# Patient Record
Sex: Male | Born: 1980 | Race: White | Hispanic: No | Marital: Married | Smoking: Former smoker
Health system: Southern US, Community
[De-identification: ages and names within clinical notes are randomized; demographics above are authoritative.]

## PROBLEM LIST (undated history)

## (undated) DIAGNOSIS — E559 Vitamin D deficiency, unspecified: Secondary | ICD-10-CM

## (undated) DIAGNOSIS — D229 Melanocytic nevi, unspecified: Secondary | ICD-10-CM

## (undated) DIAGNOSIS — G4700 Insomnia, unspecified: Secondary | ICD-10-CM

## (undated) DIAGNOSIS — I4891 Unspecified atrial fibrillation: Secondary | ICD-10-CM

## (undated) HISTORY — PX: ORIF ANKLE FRACTURE: SUR919

## (undated) HISTORY — PX: FEMUR FRACTURE SURGERY: SHX633

## (undated) HISTORY — DX: Melanocytic nevi, unspecified: D22.9

## (undated) HISTORY — DX: Vitamin D deficiency, unspecified: E55.9

## (undated) HISTORY — DX: Insomnia, unspecified: G47.00

## (undated) HISTORY — PX: FINGER SURGERY: SHX640

---

## 2012-10-26 ENCOUNTER — Ambulatory Visit: Payer: Self-pay | Admitting: Family Medicine

## 2012-10-26 VITALS — BP 114/68 | HR 65 | Temp 98.1°F | Resp 16 | Ht 69.5 in | Wt 173.8 lb

## 2012-10-26 DIAGNOSIS — Z0289 Encounter for other administrative examinations: Secondary | ICD-10-CM

## 2012-10-26 DIAGNOSIS — Z0000 Encounter for general adult medical examination without abnormal findings: Secondary | ICD-10-CM

## 2012-10-26 NOTE — Progress Notes (Signed)
[facility] [address], [place] [address] [phone]  Date:  10/26/2012   Name:  Adam Green   DOB:  1981-08-18   MRN:  [id number]  PCP:  No primary provider on file.    Chief Complaint: Employment Physical   History of Present Illness:  Adam Green is a 31 y.o. 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  He is generally very health.  He usually gets a 2 year card.  Works for [employer].  He does not take any medications.  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  He cut it processing a deer.    There is no problem list on file for this patient.   History reviewed. No pertinent past medical history.  History reviewed. No pertinent past surgical history.  History  Substance Use Topics  . Smoking status: Former Smoker    Quit date: 10/26/2006  . Smokeless tobacco: Not on file  . Alcohol Use: 1.8 oz/week    3 Cans of beer per week    History reviewed. No pertinent family history.  No Known Allergies  Medication list has been reviewed and updated.  No current outpatient prescriptions on file prior to visit.    Review of Systems:  As per HPI- otherwise negative.   Physical Examination: Filed Vitals:   10/26/12 1353  BP: 114/68  Pulse: 65  Temp: 98.1 F (36.7 C)  Resp: 16   Filed Vitals:   10/26/12 1353  Height: 5' 9.5" (1.765 m)  Weight: 173 lb 12.8 oz (78.835 kg)   Body mass index is 25.30 kg/(m^2). Ideal Body Weight: Weight in (lb) to have BMI = 25: 171.4   GEN: WDWN, NAD, Non-toxic, A & O x 3 HEENT: Atraumatic, Normocephalic. Neck supple. No masses, No LAD. Ears and Nose: No external deformity. CV: RRR, No M/G/R. No JVD. No thrill. No extra heart sounds. PULM: CTA B, no wheezes, crackles, rhonchi. No retractions. No resp. distress. No accessory muscle use. ABD: S, NT, ND, +BS. No rebound. No  HSM. EXTR: No c/c/e. Normal strength and DTR all extremities.  Missing very tip/ nail from left long finger.   NEURO Normal gait.  PSYCH: Normally interactive. Conversant. Not depressed or anxious appearing.  Calm demeanor.  GU: no hernia  Assessment and Plan: 1. Physical exam, annual    2 year DOT card- normal DOT exam   Abbe Amsterdam, MD

## 2015-07-12 ENCOUNTER — Emergency Department (HOSPITAL_COMMUNITY)
Admission: EM | Admit: 2015-07-12 | Discharge: 2015-07-13 | Disposition: A | Discharge status: Home / Self Care | Payer: BLUE CROSS/BLUE SHIELD | Attending: Emergency Medicine | Admitting: Emergency Medicine

## 2015-07-12 DIAGNOSIS — R2243 Localized swelling, mass and lump, lower limb, bilateral: Secondary | ICD-10-CM | POA: Diagnosis present

## 2015-07-12 DIAGNOSIS — Z7982 Long term (current) use of aspirin: Secondary | ICD-10-CM | POA: Diagnosis not present

## 2015-07-12 DIAGNOSIS — Z87891 Personal history of nicotine dependence: Secondary | ICD-10-CM | POA: Insufficient documentation

## 2015-07-12 DIAGNOSIS — T50905A Adverse effect of unspecified drugs, medicaments and biological substances, initial encounter: Secondary | ICD-10-CM

## 2015-07-12 DIAGNOSIS — T380X5A Adverse effect of glucocorticoids and synthetic analogues, initial encounter: Secondary | ICD-10-CM | POA: Diagnosis not present

## 2015-07-12 LAB — CBC WITH DIFFERENTIAL/PLATELET
Basophils Absolute: 0 10*3/uL (ref 0.0–0.1)
Basophils Relative: 0 % (ref 0–1)
Eosinophils Absolute: 0.1 10*3/uL (ref 0.0–0.7)
Eosinophils Relative: 1 % (ref 0–5)
HCT: 40.9 % (ref 39.0–52.0)
Hemoglobin: 13.3 g/dL (ref 13.0–17.0)
LYMPHS ABS: 1.3 10*3/uL (ref 0.7–4.0)
Lymphocytes Relative: 14 % (ref 12–46)
MCH: 29.2 pg (ref 26.0–34.0)
MCHC: 32.5 g/dL (ref 30.0–36.0)
MCV: 89.7 fL (ref 78.0–100.0)
MONO ABS: 0.7 10*3/uL (ref 0.1–1.0)
Monocytes Relative: 7 % (ref 3–12)
Neutro Abs: 7.3 10*3/uL (ref 1.7–7.7)
Neutrophils Relative %: 78 % — ABNORMAL HIGH (ref 43–77)
PLATELETS: 209 10*3/uL (ref 150–400)
RBC: 4.56 MIL/uL (ref 4.22–5.81)
RDW: 12.8 % (ref 11.5–15.5)
WBC: 9.4 10*3/uL (ref 4.0–10.5)

## 2015-07-12 NOTE — ED Provider Notes (Signed)
TIME SEEN:11:21 PM  CHIEF COMPLAINT: Reaction to steroids  HPI: HPI Comments: Adam Green is a 34 y.o. male, with no pertinent PMhx, who presents to the Emergency Department complaining of sudden onset, gradually worsening, constant bilateral knee and ankle swelling and soreness that began 3 days ago after starting a Dexpack steroid course 4 days ago. Pt describes his BLE pain to be a soreness that feels warm but is not warm to the touch. Pt additionally complains of a 13 pound weight gain since starting the steroid pack 4 days ago. He states 4 days ago he woke up with swelling and pain to right elbow that progressively worsened throughout the day. He visited an Urgent Care on Thursday, 4 days ago, where the area was drained and pt was prescribed Cephalexin and a Dexpak. Pt is on his 4th day of steroid pack and has been compliant with the antibiotic course. He states his right elbow has improved in swelling and pain since Thursday. Denies diarrhea, vomiting, nausea, or fever. Additionally denies a history of gout, known allergies, or a family history of autoimmune disease. Pt is not followed by a PCP. States that his right elbow is feeling much better. Denies any injury to his elbow, knees or ankles. No history of DVT. No recent prolonged immobilization, fracture, surgery, trauma.  ROS: See HPI Constitutional: no fever  Eyes: no drainage  ENT: no runny nose   Cardiovascular:  no chest pain  Resp: no SOB  GI: no vomiting GU: no dysuria Integumentary: no rash  Allergy: no hives  Musculoskeletal: no leg swelling  Neurological: no slurred speech ROS otherwise negative  PAST MEDICAL HISTORY/PAST SURGICAL HISTORY:  No past medical history on file.  MEDICATIONS:  Prior to Admission medications   Medication Sig Start Date End Date Taking? Authorizing Provider  acetaminophen (TYLENOL) 325 MG tablet Take 650 mg by mouth every 6 (six) hours as needed.    Historical Provider, MD  aspirin 325  MG tablet Take 325 mg by mouth. prn    Historical Provider, MD  ibuprofen (ADVIL,MOTRIN) 200 MG tablet Take 200 mg by mouth every 6 (six) hours as needed.    Historical Provider, MD    ALLERGIES:  No Known Allergies  SOCIAL HISTORY:  History  Substance Use Topics  . Smoking status: Former Smoker    Quit date: 10/26/2006  . Smokeless tobacco: Not on file  . Alcohol Use: 1.8 oz/week    3 Cans of beer per week    FAMILY HISTORY: No family history on file.  EXAM: BP 136/71 mmHg  Pulse 60  Temp(Src) 98.8 F (37.1 C) (Oral)  Resp 18  SpO2 98% CONSTITUTIONAL: Alert and oriented and responds appropriately to questions. Well-appearing; well-nourished, afebrile, nontoxic HEAD: Normocephalic EYES: Conjunctivae clear, PERRL ENT: normal nose; no rhinorrhea; moist mucous membranes; pharynx without lesions noted NECK: Supple, no meningismus, no LAD  CARD: RRR; S1 and S2 appreciated; no murmurs, no clicks, no rubs, no gallops RESP: Normal chest excursion without splinting or tachypnea; breath sounds clear and equal bilaterally; no wheezes, no rhonchi, no rales, no hypoxia or respiratory distress, speaking full sentences ABD/GI: Normal bowel sounds; non-distended; soft, non-tender, no rebound, no guarding, no peritoneal signs BACK:  The back appears normal and is non-tender to palpation, there is no CVA tenderness EXT: Normal ROM in all joints; non-tender to palpation; no edema; normal capillary refill; no cyanosis, no calf tenderness or swelling; no joint effusion; compartments are soft; 2+ DP pulses bilaterally; no signs  of septic arthritis or cellulitis; no erythema or warmth; no sign of olecranon bursitis on the right.  SKIN: Normal color for age and race; warm, no rash NEURO: Moves all extremities equally, sensation to light touch intact diffusely, cranial nerves II through XII intact PSYCH: The patient's mood and manner are appropriate. Grooming and personal hygiene are  appropriate.  MEDICAL DECISION MAKING: Patient here with symptoms of pain in his knees, ankles, weight gain and feeling like his extremities are swollen. No obvious sign of swelling on exam. No sign of septic arthritis. No sign of right olecranon bursitis, septic bursitis. Basic labs unremarkable. Hemodynamically stable. Neurologically intact. Doubt septic arthritis, gout or other autoimmune disease. He does not appear volume overload on exam. Neurovascular intact distally. I do not feel he needs x-rays of his extremities given he has no history of injury. No history of DVT and no risk factors for the same. No calf swelling or tenderness on exam. This may be adverse reaction to steroids. He is currently on his fourth day of steroids out of 5. Have discussed with patient I recommend stopping his steroids at this time. Have recommended he continue his Keflex however. Have given him outpatient follow-up information. Discussed return precautions. Discussed importance of rest, elevation of his legs. He verbalizes understanding and is comfortable with this plan.    I personally performed the services described in this documentation, which was scribed in my presence. The recorded information has been reviewed and is accurate.    Williams, DO 07/13/15 [phone]

## 2015-07-12 NOTE — ED Notes (Signed)
Patient noticed Thursday morning that he had an area on his elbow that was swollen up and filled with fluid. He went to urgent care to get it drained and was started on a steroid pack. Since then he has noticed a 13 lb weight gain. He states he has swelling in knees and right ankle, and a "little bit on the left ankle". He states they are achy and sore. The area on his elbow has been decreasing since draining it at urgent care, but he states he is having a reaction to the medication, Dexpak.

## 2015-07-13 LAB — COMPREHENSIVE METABOLIC PANEL
ALBUMIN: 4 g/dL (ref 3.5–5.0)
ALT: 31 U/L (ref 17–63)
AST: 32 U/L (ref 15–41)
Alkaline Phosphatase: 55 U/L (ref 38–126)
Anion gap: 8 (ref 5–15)
BILIRUBIN TOTAL: 0.6 mg/dL (ref 0.3–1.2)
BUN: 17 mg/dL (ref 6–20)
CHLORIDE: 107 mmol/L (ref 101–111)
CO2: 25 mmol/L (ref 22–32)
CREATININE: 0.87 mg/dL (ref 0.61–1.24)
Calcium: 9.2 mg/dL (ref 8.9–10.3)
GFR calc Af Amer: >60 mL/min (ref >60)
GLUCOSE: 115 mg/dL — AB (ref 65–99)
POTASSIUM: 4.1 mmol/L (ref 3.5–5.1)
Sodium: 140 mmol/L (ref 135–145)
TOTAL PROTEIN: 6.8 g/dL (ref 6.5–8.1)

## 2015-07-13 NOTE — Discharge Instructions (Signed)
You were seen in the ED for reaction likely secondary to your steroids. I recommend you stop taking this medication. You may continue your cephalexin. I recommend you keep your legs elevated while at rest. You may take ibuprofen 800 mg every 8 hours as needed for pain. If you develop fever, redness and warmth of your joints with associated swelling, vomiting and cannot stop, please return to the hospital.  Your labs today were normal.    Emergency Department Resource Guide 1) Find a Doctor and Pay Out of Pocket Although you won't have to find out who is covered by your insurance plan, it is a good idea to ask around and get recommendations. You will then need to call the office and see if the doctor you have chosen will accept you as a new patient and what types of options they offer for patients who are self-pay. Some doctors offer discounts or will set up payment plans for their patients who do not have insurance, but you will need to ask so you aren't surprised when you get to your appointment.  2) Contact Your Local Health Department Not all health departments have doctors that can see patients for sick visits, but many do, so it is worth a call to see if yours does. If you don't know where your local health department is, you can check in your phone book. The CDC also has a tool to help you locate your state's health department, and many state websites also have listings of all of their local health departments.  3) Find a [facility] If your illness is not likely to be very severe or complicated, you may want to try a walk in clinic. These are popping up all over the country in pharmacies, drugstores, and shopping centers. They're usually staffed by nurse practitioners or physician assistants that have been trained to treat common illnesses and complaints. They're usually fairly quick and inexpensive. However, if you have serious medical issues or chronic medical problems, these are probably not  your best option.  No Primary Care Doctor: - Call Health Connect at  [phone] - they can help you locate a primary care doctor that  accepts your insurance, provides certain services, etc. - Physician Referral Service- [phone]  Chronic Pain Problems: Organization         Address  Phone   Notes  [facility]  [phone] Patients need to be referred by their primary care doctor.   Medication Assistance: Organization         Address  Phone   Notes  [facility] Medication [facility] [address] [place], [place] [address] [phone] --Must be a resident of [facility] -- Must have NO insurance coverage whatsoever (no Medicaid/ Medicare, etc.) -- The pt. MUST have a primary care doctor that directs their care regularly and follows them in the community   MedAssist  [phone]   [employer]  [phone]    Agencies that provide inexpensive medical care: Organization         Address  Phone   Notes  [place]  [phone]   [facility]    [phone]   [facility] [place], [place] [address] [phone]   [place] [address] [place] [phone]   Planned Parenthood    [phone]   [facility]    [phone])  [place]  [address], [place] Phone:  [phone], Fax:  [phone] Hours of Operation:  9 am - 6 pm, M-F.  Also accepts Medicaid/Medicare and self-pay.  [facility] for [place], [address], [place] Phone: [phone], Fax: [phone]. Hours of Operation:  8:30 am - 5:30 pm, M-F.  Also accepts Medicaid and self-pay.  [facility] [place] [address] [place] Phone: [phone]   [place], [place], [place] [phone], Ext. [phone] Mondays & Thursdays: 7-9 AM.  First  15 patients are seen on a first come, first serve basis.    [place] Providers:  Organization         Address  Phone   Notes  [facility] [address],  [phone] Also accepts self-pay patients.  [facility] [address], [place]  [phone]   [place], [address], [place] [phone]   [facility] [address], [place] [phone]   Lucianne Lei [address], [place]   [phone] Only accepts [place] Access [place] patients after they have their name applied to their card.   Self-Pay (no insurance) in [facility]:  Organization         Address  Phone   Notes  Sickle Cell Patients, [facility] Internal Medicine [place] [phone]   [facility] Urgent Care [place] [phone]   [facility] [place]  [place], [place], [place] [phone]   [facility]/Dr. Osei-Bonsu  [address] [place] or [address], [place] [phone] Phone number for both [place] and [place] locations is the same.  [facility] [place] [address] [place] [phone]   [facility] [address] [place] or [address] [phone]   [facility] [address] [place] [phone], phone; [phone], fax Sees patients 1st and 3rd Saturday of every month.  Must not qualify for public or private insurance (i.e. Medicaid, Medicare, [place] Health Choice, Veterans' Benefits)  Household income should be no more than 200% of the poverty level The clinic cannot treat you if you are pregnant or think you are pregnant  Sexually transmitted diseases are not treated at the clinic.    Dental  Care: Organization         Address  Phone  Notes  [facility] Department of [facility] [place] [phone] Accepts children up to age 29 who are enrolled in [place] or [place]; pregnant women with a Medicaid card; and children who have applied for Medicaid or [place] Health Choice, but were declined, whose parents can pay a reduced fee at time of service.  [facility] Department of [place]  [address], [place] [phone] Accepts children up to age 38 who are enrolled in [place] or [place]; pregnant women with a Medicaid card; and children who have applied for Medicaid or [place] Health Choice, but were declined, whose parents can pay a reduced fee at time of service.  [place] Adult Dental Access  PROGRAM  [place] [phone] Patients are seen by appointment only. Walk-ins are not accepted. [place] will see patients 24 years of age and older. Monday - Tuesday (8am-5pm) Most Wednesdays (8:30-5pm) $30 per visit, cash only  [facility] Adult Dental Access PROGRAM  [address], [facility] [phone] Patients are seen by appointment only. Walk-ins are not accepted. [place] will see patients 16 years of age and older. One Wednesday Evening (Monthly: Volunteer Based).  $30 per visit, cash only  [place]  [phone] for adults; Children under age 49, call Graduate Pediatric Dentistry at [phone]. Children aged 70-14, please call [phone] to request a pediatric application.  Dental services are provided in all areas of dental care including fillings, crowns and bridges, complete and partial dentures, implants, gum treatment, root canals, and extractions. Preventive care is also provided. Treatment is provided to both adults and children. Patients are selected via a lottery and there is often a waiting list.   [facility] [address], [place]  [phone] [email or web address]   [facility] [address] [place], [place] [phone], Ext. [phone] Second and Fourth Thursday of each month, opens at 6:30 AM; Clinic ends at 9 AM.  Patients are seen on a first-come first-served basis, and a limited number are seen during each clinic.   [facility]  [address] Hillard Danker [place], [place] [phone]   Eligibility Requirements You must have lived in [place], [place], or [place] counties for at least the last three months.   You cannot be eligible for state or federal sponsored [employer], including [employer], [place], or [employer].   You generally cannot be eligible for healthcare insurance through your employer.    How to apply: Eligibility screenings are held every Tuesday and Wednesday afternoon from 1:00 pm until 4:00 pm. You do not need an appointment for the interview!  [facility] [address], [place], [place]   [place]  [place]  [place]  [phone]    Behavioral Health Resources in the Community: Intensive Outpatient Programs Organization         Address  Phone  Notes  [place]. [address] [place], [place] [phone]   [facility] Outpatient [address], [place], [place]   ADS: Alcohol & Drug Svcs [address] [place], [place]   [place] [address],  [place], [place] or [phone]   Substance Abuse Resources Organization         Address  Phone  Notes  Alcohol and Drug Services  [phone]   [place]  [phone]   [facility] [place]   Chinita Pester  [phone]   Residential & Outpatient Substance Abuse Program  [phone]    Psychological Services Organization         Address  Phone  Notes  [facility] [place]  [place]  [phone]   [place] [address], [place] or [phone]    Mobile Crisis Teams Organization         Address  Phone  Notes  [facility], Mobile Crisis Care Unit  [phone]   [facility] Psychotherapeutic Services  [address] [place], [place]  [place], [place] [phone]    Self-Help/Support Groups Organization         Address  Phone             Notes  Mental Health Assoc. of [place] - variety of support groups  [place] Call for more information  Narcotics Anonymous (NA), [facility] [address], [employer] [place]  2 meetings at this location   Special educational needs teacher         Address  Phone  Notes  [facility] Residential Treatment [place],    [place]  [phone]   [facility]  [address] [place] [phone], [place], [place]   [place], [place] [phone] Admissions: 8am-3pm M-F  Incentives Substance [place] [address]    [place], [place] [phone]   [facility] [address] [place], [place], [place]   [facility] [address],  [place], [place]   [facility] Programs - Intensive Outpatient [address], [place], [place]   [facility] ([place] [address].,  [place], [place] [phone] or [phone]   [facility]idential Treatment Services (RTS) [address], [place], [place] Accepts Medicaid  [facility] [place] [address]  [place] [phone] Substance Abuse/Addiction Treatment   [facility] Organization         Address  Phone  Notes  [facility]  [phone]   Domenic Schwab, PhD [address] Arlis Porta [place], [place]   [phone] or [phone]   Lillian Pease [place], [place] [phone]   [facility] [address] [place], [place] [phone] Insurance/Medicaid/sponsorship through [facility] and Families [address], [address]                                    [place], [place] [phone] [place] [address], [place] [phone]    Dr. Adele Schilder  [phone]   [facility] Dept. 1) [address], [place] 2) [place] 3)  [place] [address], [place] [phone]  [phone]   [place] [phone] or [phone] (After Hours)

## 2016-06-16 ENCOUNTER — Encounter (HOSPITAL_BASED_OUTPATIENT_CLINIC_OR_DEPARTMENT_OTHER): Payer: Self-pay | Admitting: *Deleted

## 2016-06-16 ENCOUNTER — Ambulatory Visit (HOSPITAL_BASED_OUTPATIENT_CLINIC_OR_DEPARTMENT_OTHER): Payer: BLUE CROSS/BLUE SHIELD | Admitting: Anesthesiology

## 2016-06-16 ENCOUNTER — Ambulatory Visit (HOSPITAL_BASED_OUTPATIENT_CLINIC_OR_DEPARTMENT_OTHER)
Admission: RE | Admit: 2016-06-16 | Discharge: 2016-06-16 | Disposition: A | Discharge status: Home / Self Care | Payer: BLUE CROSS/BLUE SHIELD | Source: Ambulatory Visit | Attending: Orthopedic Surgery | Admitting: Orthopedic Surgery

## 2016-06-16 ENCOUNTER — Encounter (HOSPITAL_BASED_OUTPATIENT_CLINIC_OR_DEPARTMENT_OTHER)
Admission: RE | Disposition: A | Discharge status: Home / Self Care | Payer: Self-pay | Source: Ambulatory Visit | Attending: Orthopedic Surgery

## 2016-06-16 DIAGNOSIS — W228XXA Striking against or struck by other objects, initial encounter: Secondary | ICD-10-CM | POA: Diagnosis not present

## 2016-06-16 DIAGNOSIS — Z7982 Long term (current) use of aspirin: Secondary | ICD-10-CM | POA: Insufficient documentation

## 2016-06-16 DIAGNOSIS — Y9389 Activity, other specified: Secondary | ICD-10-CM | POA: Diagnosis not present

## 2016-06-16 DIAGNOSIS — S60551A Superficial foreign body of right hand, initial encounter: Secondary | ICD-10-CM | POA: Insufficient documentation

## 2016-06-16 DIAGNOSIS — W458XXA Other foreign body or object entering through skin, initial encounter: Secondary | ICD-10-CM | POA: Diagnosis not present

## 2016-06-16 DIAGNOSIS — Y929 Unspecified place or not applicable: Secondary | ICD-10-CM | POA: Insufficient documentation

## 2016-06-16 HISTORY — PX: FOREIGN BODY REMOVAL: SHX962

## 2016-06-16 SURGERY — REMOVAL, FOREIGN BODY
Anesthesia: General | Site: Hand | Laterality: Right

## 2016-06-16 MED ORDER — PROPOFOL 10 MG/ML IV BOLUS
INTRAVENOUS | Status: DC | PRN
Start: 2016-06-16 — End: 2016-06-16
  Administered 2016-06-16: 200 mg via INTRAVENOUS

## 2016-06-16 MED ORDER — BUPIVACAINE HCL (PF) 0.25 % IJ SOLN
INTRAMUSCULAR | Status: DC | PRN
  Administered 2016-06-16: 4 mL

## 2016-06-16 MED ORDER — MIDAZOLAM HCL 2 MG/2ML IJ SOLN
1.0000 mg | INTRAMUSCULAR | Status: DC | PRN
  Administered 2016-06-16: 2 mg via INTRAVENOUS

## 2016-06-16 MED ORDER — LACTATED RINGERS IV SOLN
INTRAVENOUS | Status: DC
  Administered 2016-06-16: 14:00:00 via INTRAVENOUS

## 2016-06-16 MED ORDER — GLYCOPYRROLATE 0.2 MG/ML IJ SOLN: 0.2000 mg | Freq: Once | INTRAMUSCULAR | Status: DC | PRN

## 2016-06-16 MED ORDER — ONDANSETRON HCL 4 MG/2ML IJ SOLN
INTRAMUSCULAR | Status: AC
  Filled 2016-06-16: qty 2

## 2016-06-16 MED ORDER — SULFAMETHOXAZOLE-TRIMETHOPRIM 800-160 MG PO TABS: 1.0000 | ORAL_TABLET | Freq: Two times a day (BID) | ORAL | Status: DC

## 2016-06-16 MED ORDER — CEFAZOLIN SODIUM-DEXTROSE 2-4 GM/100ML-% IV SOLN
INTRAVENOUS | Status: AC
  Filled 2016-06-16: qty 100

## 2016-06-16 MED ORDER — PROPOFOL 10 MG/ML IV BOLUS
INTRAVENOUS | Status: AC
  Filled 2016-06-16: qty 20

## 2016-06-16 MED ORDER — DEXAMETHASONE SODIUM PHOSPHATE 4 MG/ML IJ SOLN
INTRAMUSCULAR | Status: DC | PRN
  Administered 2016-06-16: 10 mg via INTRAVENOUS

## 2016-06-16 MED ORDER — FENTANYL CITRATE (PF) 100 MCG/2ML IJ SOLN
50.0000 ug | INTRAMUSCULAR | Status: DC | PRN
  Administered 2016-06-16: 100 ug via INTRAVENOUS

## 2016-06-16 MED ORDER — CEFAZOLIN SODIUM-DEXTROSE 2-4 GM/100ML-% IV SOLN
2.0000 g | INTRAVENOUS | Status: AC
  Administered 2016-06-16: 2 g via INTRAVENOUS

## 2016-06-16 MED ORDER — MIDAZOLAM HCL 2 MG/2ML IJ SOLN
INTRAMUSCULAR | Status: AC
  Filled 2016-06-16: qty 2

## 2016-06-16 MED ORDER — SCOPOLAMINE 1 MG/3DAYS TD PT72: 1.0000 | MEDICATED_PATCH | Freq: Once | TRANSDERMAL | Status: DC | PRN

## 2016-06-16 MED ORDER — OXYCODONE HCL 5 MG/5ML PO SOLN: 5.0000 mg | Freq: Once | ORAL | Status: AC | PRN

## 2016-06-16 MED ORDER — HYDROMORPHONE HCL 1 MG/ML IJ SOLN: 0.2500 mg | INTRAMUSCULAR | Status: DC | PRN

## 2016-06-16 MED ORDER — ONDANSETRON HCL 4 MG/2ML IJ SOLN: 4.0000 mg | Freq: Four times a day (QID) | INTRAMUSCULAR | Status: DC | PRN

## 2016-06-16 MED ORDER — GLYCOPYRROLATE 0.2 MG/ML IV SOSY
PREFILLED_SYRINGE | INTRAVENOUS | Status: AC
  Filled 2016-06-16: qty 3

## 2016-06-16 MED ORDER — OXYCODONE HCL 5 MG PO TABS
5.0000 mg | ORAL_TABLET | Freq: Once | ORAL | Status: AC | PRN
  Administered 2016-06-16: 5 mg via ORAL

## 2016-06-16 MED ORDER — DEXAMETHASONE SODIUM PHOSPHATE 10 MG/ML IJ SOLN
INTRAMUSCULAR | Status: AC
  Filled 2016-06-16: qty 1

## 2016-06-16 MED ORDER — OXYCODONE HCL 5 MG PO TABS
ORAL_TABLET | ORAL | Status: AC
  Filled 2016-06-16: qty 1

## 2016-06-16 MED ORDER — FENTANYL CITRATE (PF) 100 MCG/2ML IJ SOLN
INTRAMUSCULAR | Status: AC
  Filled 2016-06-16: qty 2

## 2016-06-16 MED ORDER — HYDROCODONE-ACETAMINOPHEN 5-325 MG PO TABS: ORAL_TABLET | ORAL | Status: DC

## 2016-06-16 MED ORDER — LIDOCAINE 2% (20 MG/ML) 5 ML SYRINGE
INTRAMUSCULAR | Status: AC
  Filled 2016-06-16: qty 5

## 2016-06-16 MED ORDER — LIDOCAINE 2% (20 MG/ML) 5 ML SYRINGE
INTRAMUSCULAR | Status: DC | PRN
  Administered 2016-06-16: 80 mg via INTRAVENOUS

## 2016-06-16 SURGICAL SUPPLY — 49 items
BAG DECANTER FOR FLEXI CONT (MISCELLANEOUS) IMPLANT
BANDAGE ACE 3X5.8 VEL STRL LF (GAUZE/BANDAGES/DRESSINGS) IMPLANT
BLADE MINI RND TIP GREEN BEAV (BLADE) IMPLANT
BLADE SURG 15 STRL LF DISP TIS (BLADE) ×4 IMPLANT
BLADE SURG 15 STRL SS (BLADE) ×4
BNDG COHESIVE 1X5 TAN STRL LF (GAUZE/BANDAGES/DRESSINGS) IMPLANT
BNDG ELASTIC 2X5.8 VLCR STR LF (GAUZE/BANDAGES/DRESSINGS) IMPLANT
BNDG ESMARK 4X9 LF (GAUZE/BANDAGES/DRESSINGS) ×4 IMPLANT
BNDG GAUZE 1X2.1 STRL (MISCELLANEOUS) IMPLANT
BNDG GAUZE ELAST 4 BULKY (GAUZE/BANDAGES/DRESSINGS) IMPLANT
CHLORAPREP W/TINT 26ML (MISCELLANEOUS) IMPLANT
CORDS BIPOLAR (ELECTRODE) ×4 IMPLANT
COVER BACK TABLE 60X90IN (DRAPES) ×4 IMPLANT
COVER MAYO STAND STRL (DRAPES) ×4 IMPLANT
CUFF TOURNIQUET SINGLE 18IN (TOURNIQUET CUFF) ×4 IMPLANT
DRAPE EXTREMITY T 121X128X90 (DRAPE) ×4 IMPLANT
DRAPE SURG 17X23 STRL (DRAPES) ×4 IMPLANT
GAUZE PACKING IODOFORM 1/4X15 (GAUZE/BANDAGES/DRESSINGS) IMPLANT
GAUZE SPONGE 4X4 12PLY STRL (GAUZE/BANDAGES/DRESSINGS) ×4 IMPLANT
GAUZE XEROFORM 1X8 LF (GAUZE/BANDAGES/DRESSINGS) ×4 IMPLANT
GLOVE BIO SURGEON STRL SZ 6.5 (GLOVE) ×3 IMPLANT
GLOVE BIO SURGEON STRL SZ7.5 (GLOVE) ×4 IMPLANT
GLOVE BIO SURGEONS STRL SZ 6.5 (GLOVE) ×1
GLOVE BIOGEL PI IND STRL 7.0 (GLOVE) ×2 IMPLANT
GLOVE BIOGEL PI IND STRL 8 (GLOVE) ×2 IMPLANT
GLOVE BIOGEL PI INDICATOR 7.0 (GLOVE) ×2
GLOVE BIOGEL PI INDICATOR 8 (GLOVE) ×2
GOWN STRL REUS W/ TWL LRG LVL3 (GOWN DISPOSABLE) ×2 IMPLANT
GOWN STRL REUS W/TWL LRG LVL3 (GOWN DISPOSABLE) ×2
GOWN STRL REUS W/TWL XL LVL3 (GOWN DISPOSABLE) ×4 IMPLANT
LOOP VESSEL MAXI BLUE (MISCELLANEOUS) ×4 IMPLANT
NEEDLE HYPO 25X1 1.5 SAFETY (NEEDLE) ×4 IMPLANT
NS IRRIG 1000ML POUR BTL (IV SOLUTION) ×4 IMPLANT
PACK BASIN DAY SURGERY FS (CUSTOM PROCEDURE TRAY) ×4 IMPLANT
PAD CAST 3X4 CTTN HI CHSV (CAST SUPPLIES) IMPLANT
PADDING CAST ABS 4INX4YD NS (CAST SUPPLIES) ×2
PADDING CAST ABS COTTON 4X4 ST (CAST SUPPLIES) ×2 IMPLANT
PADDING CAST COTTON 3X4 STRL (CAST SUPPLIES)
SPLINT PLASTER CAST XFAST 3X15 (CAST SUPPLIES) IMPLANT
SPLINT PLASTER XTRA FASTSET 3X (CAST SUPPLIES)
STOCKINETTE 4X48 STRL (DRAPES) ×4 IMPLANT
SUT ETHILON 4 0 PS 2 18 (SUTURE) IMPLANT
SWAB COLLECTION DEVICE MRSA (MISCELLANEOUS) ×4 IMPLANT
SWAB CULTURE ESWAB REG 1ML (MISCELLANEOUS) ×4 IMPLANT
SYR BULB 3OZ (MISCELLANEOUS) ×4 IMPLANT
SYR CONTROL 10ML LL (SYRINGE) IMPLANT
TOWEL OR 17X24 6PK STRL BLUE (TOWEL DISPOSABLE) ×8 IMPLANT
TUBE FEEDING 5FR 15 INCH (TUBING) IMPLANT
UNDERPAD 30X30 (UNDERPADS AND DIAPERS) ×4 IMPLANT

## 2016-06-16 NOTE — H&P (Signed)
  Adam Green is an 35 y.o. male.   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  Has had pain in the ulnar side of the right hand.  Pain is 4-5/10 in severity.  Aggravated with movement, relieved with rest.  Xrays viewed and interpreted by me: 2 views right hand from yesterday show no fractures, dislocations.  Small radioopaque foreign body at ulnar side of hand at level of mp joint.  Soft tissue swelling. Labs reviewed: none  Allergies: No Known Allergies  History reviewed. No pertinent past medical history.  Past Surgical History  Procedure Laterality Date  . Finger surgery    . Femur fracture surgery Left   . Orif ankle fracture      Family History: History reviewed. No pertinent family history.  Social History:   reports that he has never smoked. He has never used smokeless tobacco. He reports that he drinks about 1.8 oz of alcohol per week. He reports that he does not use illicit drugs.  Medications: Medications Prior to Admission  Medication Sig Dispense Refill  . aspirin 325 MG tablet Take 325 mg by mouth. prn    . ibuprofen (ADVIL,MOTRIN) 200 MG tablet Take 400 mg by mouth every 6 (six) hours as needed.     [place] Kitchen acetaminophen (TYLENOL) 325 MG tablet Take 650 mg by mouth every 6 (six) hours as needed.    . cephALEXin (KEFLEX) 500 MG capsule Take 500 mg by mouth 4 (four) times daily.    [place] Kitchen Dexamethasone (DEXPAK 6 DAY) 1.5 MG (21) TBPK Take 1.5-3 mg by mouth 2 (two) times daily. Day 4: 2 tablets in the morning             1 tablet in the evening Day 5: 1 tablet in the morning             1 tablet in the evening Day 6: 1 tablet in the morning      No results found for this or any previous visit (from the past 48 hour(s)).  No results found.   A comprehensive review of systems was negative. Review of Systems: No fevers, chills, night sweats,  chest pain, shortness of breath, nausea, vomiting, diarrhea, constipation, easy bleeding or bruising, headaches, dizziness, vision changes, fainting.   Blood pressure 113/75, pulse 50, temperature 97.8 F (36.6 C), temperature source Oral, resp. rate 18, height 5\' 9"  (1.753 m), weight 76.658 kg (169 lb), SpO2 100 %.  General appearance: alert, cooperative and appears stated age Head: Normocephalic, without obvious abnormality, atraumatic Neck: supple, symmetrical, trachea midline Resp: clear to auscultation bilaterally Cardio: regular rate and rhythm GI: non-tender Extremities: Intact sensation and capillary refill all digits.  +epl/fpl/io.  No wounds.  Pulses: 2+ and symmetric Skin: Skin color, texture, turgor normal. No rashes or lesions Neurologic: Grossly normal Incision/Wound: Small wound at ulnar side of hand with no erythema.  Assessment/Plan Right hand foreign body.  Recommend exploration with removal foreign body in OR.  I&D as necessary.  Risks, benefits, and alternatives of surgery were discussed and the patient agrees with the plan of care.   Adam Green R 06/16/2016, 3:14 PM

## 2016-06-16 NOTE — Anesthesia Preprocedure Evaluation (Signed)
Anesthesia Evaluation  Patient identified by MRN, date of birth, ID band Patient awake    Reviewed: Allergy & Precautions, H&P , NPO status , Patient's Chart, lab work & pertinent test results  Airway Mallampati: II   Neck ROM: full    Dental   Pulmonary neg pulmonary ROS,    breath sounds clear to auscultation       Cardiovascular negative cardio ROS   Rhythm:regular Rate:Normal     Neuro/Psych    GI/Hepatic   Endo/Other    Renal/GU      Musculoskeletal   Abdominal   Peds  Hematology   Anesthesia Other Findings   Reproductive/Obstetrics                             Anesthesia Physical Anesthesia Plan  ASA: I  Anesthesia Plan: General   Post-op Pain Management:    Induction: Intravenous  Airway Management Planned: LMA  Additional Equipment:   Intra-op Plan:   Post-operative Plan:   Informed Consent: I have reviewed the patients History and Physical, chart, labs and discussed the procedure including the risks, benefits and alternatives for the proposed anesthesia with the patient or authorized representative who has indicated his/her understanding and acceptance.     Plan Discussed with: CRNA, Anesthesiologist and Surgeon  Anesthesia Plan Comments:         Anesthesia Quick Evaluation

## 2016-06-16 NOTE — Brief Op Note (Signed)
06/16/2016  4:08 PM  PATIENT:  Adam Green  35 y.o. male  PRE-OPERATIVE DIAGNOSIS:  Right hand foreign body  POST-OPERATIVE DIAGNOSIS:  Right hand  foreign body  PROCEDURE:  Procedure(s): FOREIGN BODY REMOVAL (Right)  SURGEON:  Surgeon(s) and Role:    * Leanora Cover, MD - Primary  PHYSICIAN ASSISTANT:   ASSISTANTS: none   ANESTHESIA:   general  EBL:     BLOOD ADMINISTERED:none  DRAINS: vessel loop  LOCAL MEDICATIONS USED:  MARCAINE     SPECIMEN:  No Specimen  DISPOSITION OF SPECIMEN:  N/A  COUNTS:  YES  TOURNIQUET:  * Missing tourniquet times found for documented tourniquets in log:  [id number] *  DICTATION: .Other Dictation: Dictation Number [id number]  PLAN OF CARE: Discharge to home after PACU  PATIENT DISPOSITION:  PACU - hemodynamically stable.

## 2016-06-16 NOTE — Discharge Instructions (Addendum)

## 2016-06-16 NOTE — Anesthesia Procedure Notes (Signed)
Procedure Name: LMA Insertion Date/Time: 06/16/2016 3:44 PM Performed by: Lyndee Leo Pre-anesthesia Checklist: Patient identified, Emergency Drugs available, Suction available and Patient being monitored Patient Re-evaluated:Patient Re-evaluated prior to inductionOxygen Delivery Method: Circle system utilized Preoxygenation: Pre-oxygenation with 100% oxygen Intubation Type: IV induction Ventilation: Mask ventilation without difficulty LMA: LMA inserted LMA Size: 5.0 Number of attempts: 1 Airway Equipment and Method: Bite block Placement Confirmation: positive ETCO2 Tube secured with: Tape Dental Injury: Teeth and Oropharynx as per pre-operative assessment

## 2016-06-16 NOTE — Anesthesia Postprocedure Evaluation (Signed)
Anesthesia Post Note  Patient: Adam Green  Procedure(s) Performed: Procedure(s) (LRB): FOREIGN BODY REMOVAL (Right)  Patient location during evaluation: PACU Anesthesia Type: General Level of consciousness: awake and alert and patient cooperative Pain management: pain level controlled Vital Signs Assessment: post-procedure vital signs reviewed and stable Respiratory status: spontaneous breathing and respiratory function stable Cardiovascular status: stable Anesthetic complications: no    Last Vitals:  Filed Vitals:   06/16/16 1630 06/16/16 1645  BP: 115/81 100/61  Pulse: 55 51  Temp:  36.6 C  Resp: 15 18    Last Pain:  Filed Vitals:   06/16/16 1650  PainSc: 3                  Adam Green S

## 2016-06-16 NOTE — Op Note (Signed)
[id number] 

## 2016-06-16 NOTE — Transfer of Care (Signed)
Immediate Anesthesia Transfer of Care Note  Patient: Adam Green  Procedure(s) Performed: Procedure(s): FOREIGN BODY REMOVAL (Right)  Patient Location: PACU  Anesthesia Type:General  Level of Consciousness: awake, alert , oriented and patient cooperative  Airway & Oxygen Therapy: Patient Spontanous Breathing and Patient connected to face mask oxygen  Post-op Assessment: Report given to RN and Post -op Vital signs reviewed and stable  Post vital signs: Reviewed and stable  Last Vitals:  Filed Vitals:   06/16/16 1404  BP: 113/75  Pulse: 50  Temp: 36.6 C  Resp: 18    Last Pain:  Filed Vitals:   06/16/16 1405  PainSc: 4       Patients Stated Pain Goal: 2 ([id number])  Complications: No apparent anesthesia complications

## 2016-06-17 NOTE — Op Note (Signed)
Adam Green, Adam Green NO.:  [id number]  MEDICAL RECORD NO.:  [id number]  LOCATION:                                 FACILITY:  PHYSICIAN:  Leanora Cover, MD        DATE OF BIRTH:  1981/02/05  DATE OF PROCEDURE:  06/16/2016 DATE OF DISCHARGE:                              OPERATIVE REPORT   PREOPERATIVE DIAGNOSIS:  Right hand foreign body.  POSTOPERATIVE DIAGNOSIS:  Right hand foreign body.  PROCEDURE:  Removal of deep foreign body, right hand.  SURGEON:  Leanora Cover, MD.  ASSISTANT:  None.  ANESTHESIA:  General.  IV FLUIDS:  Per anesthesia flow sheet.  ESTIMATED BLOOD LOSS:  Minimal.  COMPLICATIONS:  None.  SPECIMENS:  None.  TOURNIQUET TIME:  Less than 15 minutes.  DISPOSITION:  Stable to PACU.  INDICATIONS:  The patient is a 35 year old male who states that yesterday he was pushing up a piece of broken socket when a wood splinter went into his right hand.  He was seen at [facility] where radiographs were taken, revealed a foreign body.  He was referred to Raliegh Ip who then referred him to me for further care.  He feels there is foreign body remaining.  I recommended operative removal in the operating room.  Risks, benefits, and alternatives of surgery were discussed including risk of blood loss; infection; damage to nerves, vessels, tendons, ligaments, bone; failure of surgery; need for additional surgery; complications with wound healing; continued pain, retained foreign body.  He voiced understanding of these risks and elected to proceed.  OPERATIVE COURSE:  After being identified preoperatively by myself, the patient and I agreed upon procedure and site of procedure.  Surgical site was marked.  Risks, benefits, and alternatives of surgery were reviewed, and he wished to proceed.  Surgical consent had been signed. He was transferred to the operating room and was placed on an operating room table in supine position with the right upper  extremity on arm board.  General anesthesia was induced by Anesthesiology.  Right upper extremity was prepped and draped in normal sterile orthopedic fashion. Surgical pause was performed between surgeons, anesthesia, operating room staff; and all were in agreement as to the patient, procedure, and site of procedure.  Tourniquet at the proximal aspect of the extremity was inflated to 250 mmHg after exsanguination of the limb with an Esmarch bandage.  Incision was made at the ulnar side of the hand incorporating the traumatic wound.  This was carried into subcutaneous tissues by spreading technique.  The foreign body was identified.  It appeared to be a wood splitter.  It was removed.  The foreign body went down into the muscle of the adductor digiti minimi.  There was no gross purulence.  No foreign body was noted to be remaining.  The wound was explored.  Again no gross purulence.  The ulnar digital nerve to the small finger was identified and was intact.  The flexor tendon was outside the zone of injury.  The wound was copiously irrigated with sterile saline and was closed with 4-0 nylon in a horizontal mattress fashion with the exception of the traumatic portion of the wound which was  left open and a vessel loop drain placed.  The wound was then dressed with sterile Xeroform, 4x4s, and wrapped with a Kerlix and a Coban dressing lightly.  The wound had been injected with 6 mL of 0.25% plain Marcaine to aid in postoperative analgesia.  The tourniquet was deflated at less than 15 minutes.  Fingertips were pink with brisk capillary refill after deflation of the tourniquet.  Operative drapes were broken down.  The patient was awoken from anesthesia safely.  He was transferred back to a stretcher and taken to PACU in stable condition.  I will see him back in the office in one week for postoperative followup.  I will give him Norco 5/325, 1-2 p.o. q.6 hours p.r.n. pain, dispensed  #20.     Leanora Cover, MD   ______________________________ Leanora Cover, MD    KK/MEDQ  D:  06/16/2016  T:  06/16/2016  Job:  [id number]

## 2016-06-19 ENCOUNTER — Encounter (HOSPITAL_BASED_OUTPATIENT_CLINIC_OR_DEPARTMENT_OTHER): Payer: Self-pay | Admitting: Orthopedic Surgery

## 2016-08-24 ENCOUNTER — Ambulatory Visit (INDEPENDENT_AMBULATORY_CARE_PROVIDER_SITE_OTHER): Payer: BLUE CROSS/BLUE SHIELD

## 2016-08-24 DIAGNOSIS — R002 Palpitations: Secondary | ICD-10-CM | POA: Diagnosis not present

## 2016-09-02 ENCOUNTER — Ambulatory Visit (INDEPENDENT_AMBULATORY_CARE_PROVIDER_SITE_OTHER): Payer: BLUE CROSS/BLUE SHIELD | Admitting: Cardiology

## 2016-09-02 ENCOUNTER — Encounter (INDEPENDENT_AMBULATORY_CARE_PROVIDER_SITE_OTHER): Payer: Self-pay

## 2016-09-02 ENCOUNTER — Encounter: Payer: Self-pay | Admitting: Cardiology

## 2016-09-02 ENCOUNTER — Telehealth: Payer: Self-pay | Admitting: Nurse Practitioner

## 2016-09-02 VITALS — BP 116/82 | HR 51 | Ht 69.0 in | Wt 175.0 lb

## 2016-09-02 DIAGNOSIS — I480 Paroxysmal atrial fibrillation: Secondary | ICD-10-CM | POA: Diagnosis not present

## 2016-09-02 DIAGNOSIS — I4891 Unspecified atrial fibrillation: Secondary | ICD-10-CM | POA: Diagnosis not present

## 2016-09-02 LAB — CBC
HEMATOCRIT: 43.9 % (ref 38.5–50.0)
HEMOGLOBIN: 15.2 g/dL (ref 13.2–17.1)
MCH: 30.3 pg (ref 27.0–33.0)
MCHC: 34.6 g/dL (ref 32.0–36.0)
MCV: 87.5 fL (ref 80.0–100.0)
MPV: 9.6 fL (ref 7.5–12.5)
Platelets: 234 10*3/uL (ref 140–400)
RBC: 5.02 MIL/uL (ref 4.20–5.80)
RDW: 13.2 % (ref 11.0–15.0)
WBC: 5.3 10*3/uL (ref 3.8–10.8)

## 2016-09-02 LAB — BASIC METABOLIC PANEL WITH GFR
BUN: 17 mg/dL (ref 7–25)
CO2: 26 mmol/L (ref 20–31)
Calcium: 9.5 mg/dL (ref 8.6–10.3)
Chloride: 104 mmol/L (ref 98–110)
Creat: 0.86 mg/dL (ref 0.60–1.35)
Glucose, Bld: 79 mg/dL (ref 65–99)
Potassium: 4.4 mmol/L (ref 3.5–5.3)
Sodium: 140 mmol/L (ref 135–146)

## 2016-09-02 LAB — MAGNESIUM: Magnesium: 2.1 mg/dL (ref 1.5–2.5)

## 2016-09-02 LAB — TSH: TSH: 1.23 m[IU]/L (ref 0.40–4.50)

## 2016-09-02 NOTE — Patient Instructions (Addendum)
Medication Instructions:   Your physician recommends that you continue on your current medications as directed. Please refer to the Current Medication list given to you today.   If you need a refill on your cardiac medications before your next appointment, please call your pharmacy.  Labwork: CBC BMET MAG AND TSH    Testing/Procedures: Your physician has requested that you have an echocardiogram. Echocardiography is a painless test that uses sound waves to create images of your heart. It provides your doctor with information about the size and shape of your heart and how well your heart's chambers and valves are working. This procedure takes approximately one hour. There are no restrictions for this procedure.      Follow-Up: IN WEEK OF October 23  WITH KLEIN URSUY OR SEILER   Any Other Special Instructions Will Be Listed Below (If Applicable).

## 2016-09-02 NOTE — Progress Notes (Signed)
09/02/2016 Adam Green   1981/10/21  [id number]  Primary Physician No PCP Per Patient Primary Cardiologist/ Electrophysiologist: New (Dr. Caryl Comes)   Reason for Visit/CC: New Pt Evaluation for New Onset Atrial Fibrillation/ Flutter   HPI:  The patient is a 35 y/o white male, who presents as a new patient for evaluation for new onset atrial flutter/ fibrillation, that was detected with ambulator cardiac monitoring, that was ordered for evaluation of palpations. This was ordered by his PCP at [facility], whom he recently established care with. The monitor findings were read by Dr. Caryl Comes who recommended that he come in for assessment. He moved to [place] with his wife, 5 years ago from [place].   He has no prior cardiac history. Only family h/o cardiac issues is a paternal grandfather who died from an MI while in his 5s. No parents or siblings with cardiac issues. He denies any prior h/o syncope/ near syncope.  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 As a result, his PMH  has primarily been related to orthopedic conditions including prior femur and ankle fractures, plus finger/ hand surgery. He is out in the heat most of the day but tries to stay well hydrated. No recent n/v/d. He drink on average 2 cups of coffee a day. He drinks ETOH, mostly beers, but nothing in excess. May have 1-2 beers over the weekend. He is a former smoker. He smoked for ~5 years from age [age]. He denies an other recreational drug use. No h/o stroke, TIA, HTN, DM or heart failure.   He reports that over the past year, he has experienced an unusual feeling in his chest. Fluttering sensation. Fast heart beat. Feels a bit tired and short of breath when this happens but no dizziness, syncope/ near syncope. Symptoms would last anywhere from 5-30 minutes at a time. Initially , episodes would occur maybe once or twice a month, however symptoms have become more frequent. Symptoms always occurs at night around  8:30-9:00 PM. He has not noticed much daytime symptoms. His wife notes that he does not snore. He denies every feeling symptoms while climbing trees. Although afib has been detected, he reports that he has felt symptoms but has not had a severe episode since wearing the monitor.   EKG in clinic today shows sinus bradycardia. HR is 51 bpm. QT/QTc is 410/377 ms. He is asymptomatic. BP is stable at 116/82.     Current Meds  Medication Sig  . ibuprofen (ADVIL,MOTRIN) 200 MG tablet Take 400 mg by mouth every 6 (six) hours as needed (pain).    No Known Allergies No past medical history on file. No family history on file. Past Surgical History:  Procedure Laterality Date  . FEMUR FRACTURE SURGERY Left   . FINGER SURGERY    . FOREIGN BODY REMOVAL Right 06/16/2016   Procedure: FOREIGN BODY REMOVAL;  Surgeon: Leanora Cover, MD;  Location: [facility];  Service: Orthopedics;  Laterality: Right;  . ORIF ANKLE FRACTURE     Social History   Social History  . Marital status: Married    Spouse name: N/A  . Number of children: N/A  . Years of education: N/A   Occupational History  . Not on file.   Social History Main Topics  . Smoking status: Never Smoker  . Smokeless tobacco: Never Used  . Alcohol use 1.8 oz/week    3 Cans of beer per week     Comment: per week  .  Drug use: No  . Sexual activity: Yes    Birth control/ protection: None     Comment: 1 partner   Other Topics Concern  . Not on file   Social History Narrative  . No narrative on file     Review of Systems: General: negative for chills, fever, night sweats or weight changes.  Cardiovascular: negative for chest pain, dyspnea on exertion, edema, orthopnea, palpitations, paroxysmal nocturnal dyspnea or shortness of breath Dermatological: negative for rash Respiratory: negative for cough or wheezing Urologic: negative for hematuria Abdominal: negative for nausea, vomiting, diarrhea, bright red blood per  rectum, melena, or hematemesis Neurologic: negative for visual changes, syncope, or dizziness All other systems reviewed and are otherwise negative except as noted above.   Physical Exam:  Blood pressure 116/82, pulse (!) 51, height 5\' 9"  (1.753 m), weight 175 lb (79.4 kg).  General appearance: alert, cooperative, no distress and young Neck: no carotid bruit and no JVD Lungs: clear to auscultation bilaterally Heart: regular rhythm, regular rate, S1, S2 normal, no murmur, click, rub or gallop Extremities: no LEE Pulses: 2+ and symmetric Skin: warm and dry Neurologic: Grossly normal  EKG sinus brady 51 bpm   ASSESSMENT AND PLAN:   1. New Onset Atrial Fibrillation: monitor findings have been reviewed by Dr. Caryl Comes and confirmed. His arrhthymias has been paroxsymal. His office EKG shows that he is back in normal rhythm. Sinus brady with rate of 51 bpm. He is currently asymptomatic. Although afib has been detected, he reports that he has felt symptoms but has not had a severe episode since wearing the monitor. Given he has not had a severe episode like he has felt in the past, Dr. Caryl Comes wants him to continue wearing the monitor to assess for other arrhthymias that may occur. Continue duration for intended 30 day period. For now, we will check baseline labs including a CBC, BMP, Mg and TSH as well as arrange for a 2D echo to assess for structural heart disease. He will f/u at the end of his monitor evaluation and echo with either Dr. Caryl Comes or EP APP to decide further on treatment strategy such as AAD therapy vs ablation. In the meantime, he has been instructed to avoid triggers, including caffeine and ETOH. His CHA2DS2 VASc score is 0. I've discussed with Dr. Caryl Comes. No indication for ASA.   The patient was also seen and examined by Dr. Caryl Comes, who agrees with the above assessment and plan.    PLAN f/u in EP clinic in 4 weeks.   Lyda Jester PA-C 09/02/2016 12:17 PM

## 2016-09-02 NOTE — Telephone Encounter (Signed)
Received monitor report on patient who is currently wearing a 30 day event monitor as ordered by Dr. Lynelle Doctor, PCP.  The monitor shows atrial fib/flutter which was verified with Dr. Caryl Comes who is in the office today.  I notified the patient by telephone and advised him of the monitor results and that we would like to see him in the office today.  He is scheduled to see Ellen Henri, PA today at 11:30.  He verbalized understanding and agreement and thanked me for the call.

## 2016-09-05 ENCOUNTER — Ambulatory Visit: Payer: BLUE CROSS/BLUE SHIELD | Admitting: Cardiology

## 2016-09-06 ENCOUNTER — Telehealth: Payer: Self-pay | Admitting: *Deleted

## 2016-09-06 NOTE — Telephone Encounter (Signed)
-----   Message from Consuelo Pandy, [place] sent at 09/05/2016  8:11 AM EDT ----- Labs are normal. No abnormalities in blood to explain cardiac arrhthymias. Continue to wear heart monitor and get cardiac ultrasound.

## 2016-09-06 NOTE — Telephone Encounter (Signed)
SPOKE TO PT ABOUT RESULTS AND VERBALIZED UNDERSTANDING  

## 2016-09-15 ENCOUNTER — Ambulatory Visit (HOSPITAL_COMMUNITY): Payer: BLUE CROSS/BLUE SHIELD | Attending: Cardiology

## 2016-09-15 ENCOUNTER — Other Ambulatory Visit: Payer: Self-pay

## 2016-09-15 DIAGNOSIS — I4891 Unspecified atrial fibrillation: Secondary | ICD-10-CM | POA: Diagnosis not present

## 2016-09-15 DIAGNOSIS — I340 Nonrheumatic mitral (valve) insufficiency: Secondary | ICD-10-CM | POA: Diagnosis not present

## 2016-09-15 DIAGNOSIS — Z23 Encounter for immunization: Secondary | ICD-10-CM | POA: Diagnosis not present

## 2016-09-20 ENCOUNTER — Telehealth: Payer: Self-pay | Admitting: Cardiology

## 2016-09-20 NOTE — Telephone Encounter (Signed)
New message    Pt verbalized that he is returning a call about his echo results

## 2016-09-20 NOTE — Telephone Encounter (Signed)
Returned pts call and went over test results.

## 2016-09-21 ENCOUNTER — Telehealth: Payer: Self-pay | Admitting: Cardiology

## 2016-09-21 NOTE — Telephone Encounter (Signed)
Returned pts call.  I have let him know that [place] has some of his monitor readings and his symptoms from Sunday and as soon as she has a chance to look at it, (in between pts or later this afternoon) then we would call him back.  Pt was very appreciative of the quick response.

## 2016-09-21 NOTE — Telephone Encounter (Signed)
Follow up  Pt voiced he is call back to get updated results or if we have found anything and calling to follow up  Please f/u with pt

## 2016-09-22 ENCOUNTER — Telehealth: Payer: Self-pay | Admitting: Cardiology

## 2016-09-22 NOTE — Telephone Encounter (Signed)
Pt is upset he is still waiting to get test results from his monitor reading. He states he was just dx with afib and is really concerned about his health. He wants to know what is the next step in his health care regarding this new dx. Would like a phone call from [place] asap.

## 2016-09-23 MED ORDER — METOPROLOL TARTRATE 50 MG PO TABS: 50.0000 mg | ORAL_TABLET | Freq: Every day | ORAL | 1 refills | Status: DC | PRN

## 2016-09-23 NOTE — Telephone Encounter (Signed)
Patient called and was upset that he was told that an rx for a new medication would be sent to [facility] today but when his wife went to pick it up they did not have anything. He would like a call back at [phone]. Thanks, [place]

## 2016-09-23 NOTE — Telephone Encounter (Signed)
Informed patient and his wife rx sent to pharmacy -- apologized for the delay. They voiced wanting to switch appt to see Dr. Caryl Comes instead of Dr. Curt Bears at this time  (they have already met with Caryl Comes).  They understand that if they decide to have ablation procedure in the future that they would see a different EP doctor for that. Spent about 20 minutes talking with the wife.  She thanks me for taking the time to explain things.

## 2016-09-23 NOTE — Telephone Encounter (Signed)
Called pt about event monitor  I have reviewed with Adam Green since Adam Green is out of town Intermittent afib with RVR  Recom:  Metoprolol tartrate 50 mg to be taken in an as needed basis for atrial fib  2.  Set up appt with [facility] to evaluate, esp for afib ablation.They should call pt to set up.

## 2016-09-29 ENCOUNTER — Institutional Professional Consult (permissible substitution): Payer: BLUE CROSS/BLUE SHIELD | Admitting: Cardiology

## 2016-10-03 ENCOUNTER — Emergency Department (HOSPITAL_COMMUNITY): Payer: BLUE CROSS/BLUE SHIELD

## 2016-10-03 ENCOUNTER — Ambulatory Visit: Payer: BLUE CROSS/BLUE SHIELD | Admitting: Physician Assistant

## 2016-10-03 ENCOUNTER — Telehealth: Payer: Self-pay | Admitting: Internal Medicine

## 2016-10-03 ENCOUNTER — Emergency Department (HOSPITAL_COMMUNITY)
Admission: EM | Admit: 2016-10-03 | Discharge: 2016-10-03 | Disposition: A | Discharge status: Home / Self Care | Payer: BLUE CROSS/BLUE SHIELD | Attending: Emergency Medicine | Admitting: Emergency Medicine

## 2016-10-03 ENCOUNTER — Encounter (HOSPITAL_COMMUNITY): Payer: Self-pay | Admitting: Emergency Medicine

## 2016-10-03 DIAGNOSIS — R002 Palpitations: Secondary | ICD-10-CM

## 2016-10-03 DIAGNOSIS — I480 Paroxysmal atrial fibrillation: Secondary | ICD-10-CM | POA: Insufficient documentation

## 2016-10-03 DIAGNOSIS — Z87891 Personal history of nicotine dependence: Secondary | ICD-10-CM | POA: Diagnosis not present

## 2016-10-03 DIAGNOSIS — Z7982 Long term (current) use of aspirin: Secondary | ICD-10-CM | POA: Insufficient documentation

## 2016-10-03 HISTORY — DX: Unspecified atrial fibrillation: I48.91

## 2016-10-03 LAB — CBC
HCT: 42.9 % (ref 39.0–52.0)
Hemoglobin: 14.7 g/dL (ref 13.0–17.0)
MCH: 30.4 pg (ref 26.0–34.0)
MCHC: 34.3 g/dL (ref 30.0–36.0)
MCV: 88.6 fL (ref 78.0–100.0)
PLATELETS: 193 10*3/uL (ref 150–400)
RBC: 4.84 MIL/uL (ref 4.22–5.81)
RDW: 12.7 % (ref 11.5–15.5)
WBC: 4.5 10*3/uL (ref 4.0–10.5)

## 2016-10-03 LAB — BASIC METABOLIC PANEL
Anion gap: 7 (ref 5–15)
BUN: 11 mg/dL (ref 6–20)
CALCIUM: 9.4 mg/dL (ref 8.9–10.3)
CO2: 24 mmol/L (ref 22–32)
CREATININE: 0.81 mg/dL (ref 0.61–1.24)
Chloride: 109 mmol/L (ref 101–111)
GFR calc Af Amer: >60 mL/min (ref >60)
GFR calc non Af Amer: >60 mL/min (ref >60)
GLUCOSE: 107 mg/dL — AB (ref 65–99)
Potassium: 4.1 mmol/L (ref 3.5–5.1)
Sodium: 140 mmol/L (ref 135–145)

## 2016-10-03 LAB — I-STAT TROPONIN, ED: TROPONIN I, POC: 0 ng/mL (ref 0.00–0.08)

## 2016-10-03 NOTE — ED Triage Notes (Signed)
Pt states recent hx of afib.  Was due for 1st appt with Dr Cleda Mccreedy on Wed.  However, today while at work he experienced rapid HR, nausea, dizzy, cold/tingling sensation in chest.  Presently NSR.Adam Green

## 2016-10-03 NOTE — Telephone Encounter (Signed)
**Note De-Identified Elinda Bunten Obfuscation** The pt and his wife walked into the office after leaving the ER today requesting that the pt be seen today. The pt states that he has had 3 episodes of A-fib in the last 24 hours and that he wants to talk to Dr Caryl Comes today as he does not want to wait 2 more days for his OV. I explained that Dr Caryl Comes has a full schedule today and encouraged the pt to follow the ER recommendations by taking Metoprolol 50 mg as needed for palpitations and to see Dr Caryl Comes in 2 days.  The pt and his wife stated they would wait until the end of the day to see Dr Caryl Comes if they have to. I advised them that I would talk with Dr Caryl Comes and get back with them.  I discussed with Dr Caryl Comes who also recommended that the pt should follow the ERs recommendations and that he would see him in 2 days.  Also, I spoke with Con Memos, the Publishing copy, who went to the waiting room with me to bring the pt and his wife to a private pt room to talk. Con Memos explained to the pt and his wife the need for the pt to try what the ER has recommended. They had a lot of questions concerning how long it is taking for the pt to feel/get better and that they want to quickly move to the next step in his care.  Con Memos then went to talk with Dr Caryl Comes while I stayed in the room with the pt and his wife. When Con Memos returned to the room she reiterated Dr Aquilla Hacker recommendation to take 50 mg of Metoprolol as needed for palpitations and to f/u with Dr Caryl Comes in 2 days. She also advised that the pt should not to drink/eat any caffeine. The pt and his wife both verbalized understanding and thanked Con Memos and I for our assistance.

## 2016-10-03 NOTE — Telephone Encounter (Signed)
New MEssage  Pt wife call requesting to speak with RN about getting pt seen today. Pt wife states pt has had is third Afib episode in 24 hours. Pt wife states pt is currently being released from the hospital and would like to get pt seen today if possible. Please call back to discuss

## 2016-10-03 NOTE — ED Provider Notes (Signed)
[facility] DEPT Provider Note   CSN: [id number] Arrival date & time: 10/03/16  1022     History   Chief Complaint Chief Complaint  Patient presents with  . Palpitations    HPI Cloyde Shanklin Jasmer is a 35 y.o. male.  Patient is a 35 year old male with history of paroxysmal A. fib who presents the ED with complaint of heart palpitations. 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 Endorses associated nausea, lightheadedness and a cool/tingling sensation to his mid sternal chest. Patient reports this episode felt similar to episodes he has had in the past related to his A. fib but notes this one was significantly worse. He notes his symptoms lasted for approximately 1.5 hours and have since resolved since arrival to the ED. Denies taking any medications for symptoms. Denies fever, chills, headache, neck stiffness, cough, shortness of breath, wheezing, chest pain, vomiting, abdominal pain, numbness, weakness, leg swelling. Patient denies currently taking any medications. He reports that he was wearing a cardiac monitor but states he was advised to stop wearing it on 10/21. He reports having 2 other or mild episodes this week. States he has had approximately 4-5 episodes weekly over the past month. He notes he is being seen by Dr. Caryl Comes and states he has a follow-up appointment scheduled with him in 2 days.      Past Medical History:  Diagnosis Date  . A-fib [facility])     Patient Active Problem List   Diagnosis Date Noted  . PAF (paroxysmal atrial fibrillation) ([place]) 09/02/2016    Past Surgical History:  Procedure Laterality Date  . FEMUR FRACTURE SURGERY Left   . FINGER SURGERY    . FOREIGN BODY REMOVAL Right 06/16/2016   Procedure: FOREIGN BODY REMOVAL;  Surgeon: Leanora Cover, MD;  Location: [place];   Service: Orthopedics;  Laterality: Right;  . ORIF ANKLE FRACTURE      OB History    No data available       Home Medications    Prior to Admission medications   Medication Sig Start Date End Date Taking? Authorizing Provider  aspirin 325 MG tablet Take 325 mg by mouth daily as needed for mild pain.   Yes Historical Provider, MD  ibuprofen (ADVIL,MOTRIN) 200 MG tablet Take 400 mg by mouth every 6 (six) hours as needed (pain).    Yes Historical Provider, MD  Multiple Vitamins-Minerals (MULTIVITAMIN WITH MINERALS) tablet Take 1 tablet by mouth daily.   Yes Historical Provider, MD  metoprolol (LOPRESSOR) 50 MG tablet Take 1 tablet (50 mg total) by mouth daily as needed. 09/23/16 12/22/16  Fay Records, MD    Family History No family history on file.  Social History Social History  Substance Use Topics  . Smoking status: Former Smoker    Types: Cigarettes    Quit date: 10/26/2006  . Smokeless tobacco: Never Used  . Alcohol use 1.8 oz/week    3 Cans of beer per week     Comment: per week     Allergies   Dexpak 10 day [dexamethasone]   Review of Systems Review of Systems  Cardiovascular: Positive for chest pain ("coolness/tingling sensation to midsternal chest") and palpitations.  Gastrointestinal: Positive for nausea.  Neurological: Positive for light-headedness.  All other systems reviewed and are negative.    Physical Exam  Updated Vital Signs BP 105/76 (BP Location: Right Arm)   Pulse (!) 55   Temp 98 F (36.7 C) (Oral)   Resp 14   Ht 5\' 9"  (1.753 m)   Wt 77.1 kg   SpO2 98%   BMI 25.10 kg/m   Physical Exam  Constitutional: He is oriented to person, place, and time. He appears well-developed and well-nourished.  HENT:  Head: Normocephalic and atraumatic.  Mouth/Throat: Oropharynx is clear and moist. No oropharyngeal exudate.  Eyes: Conjunctivae and EOM are normal. Right eye exhibits no discharge. Left eye exhibits no discharge. No scleral icterus.  Neck:  Normal range of motion. Neck supple.  Cardiovascular: Normal rate, regular rhythm, normal heart sounds and intact distal pulses.   HR 74  Pulmonary/Chest: Effort normal and breath sounds normal. No respiratory distress. He has no wheezes. He has no rales. He exhibits no tenderness.  Abdominal: Soft. Bowel sounds are normal. He exhibits no distension and no mass. There is no tenderness. There is no rebound and no guarding. No hernia.  Musculoskeletal: Normal range of motion. He exhibits no edema.  Neurological: He is alert and oriented to person, place, and time.  Skin: Skin is warm and dry.  Nursing note and vitals reviewed.   ED Treatments / Results  Labs (all labs ordered are listed, but only abnormal results are displayed) Labs Reviewed  BASIC METABOLIC PANEL - Abnormal; Notable for the following:       Result Value   Glucose, Bld 107 (*)    All other components within normal limits  CBC  I-STAT TROPOININ, ED    EKG  EKG Interpretation  Date/Time:  Monday October 03 2016 10:27:33 EDT Ventricular Rate:  88 PR Interval:  150 QRS Duration: 86 QT Interval:  350 QTC Calculation: 423 R Axis:   89 Text Interpretation:  Normal sinus rhythm with sinus arrhythmia Possible Left atrial enlargement Borderline ECG No old tracing to compare Confirmed by Wahak Hotrontk MD, SCOTT [phone]) on 10/03/2016 10:46:38 AM       Radiology Dg Chest 2 View  Result Date: 10/03/2016 CLINICAL DATA:  Chest pain.  Weakness. EXAM: CHEST  2 VIEW COMPARISON:  No prior. FINDINGS: Mediastinum hilar structures normal. Lungs are clear. Heart size normal. No pleural effusion or pneumothorax. No acute bony abnormality . IMPRESSION: No acute cardiopulmonary disease. Electronically Signed   By: Marcello Moores  Register   On: 10/03/2016 11:20    Procedures Procedures (including critical care time)  Medications Ordered in ED Medications - No data to display   Initial Impression / Assessment and Plan / ED Course  I have  reviewed the triage vital signs and the nursing notes.  Pertinent labs & imaging results that were available during my care of the patient were reviewed by me and considered in my medical decision making (see chart for details).  Clinical Course    Patient presents with episode of heart palpitations that occurred while he was at work the morning. Episode lasted approximately 1.5 hours with associated nausea and lightheadedness which have resolved since arrival to the ED. VSS. Exam unremarkable. Cardiac monitor showed sinus rhythm, HR 60.   This patients CHA2DS2-VASc Score and unadjusted Ischemic Stroke Rate (% per year) is equal to 0.2 % stroke rate/year from a score of 0  Above score calculated as 1 point each if present [CHF, HTN, DM, Vascular=MI/PAD/Aortic Plaque, Age if 65-74, or Male] Above score calculated as 2 points each if present [Age > 75, or Stroke/TIA/TE]  Chart review  shows patient was initially evaluated by cardiology on 9/29 for new onset atrial flutter/fibrillation which was detected with ambulatory cardiac monitor that was ordered by patient's PCP for evaluation of palpitations. Arrhthymias appeared to be paroxysmal. EKG in office showed sinus brady HR 51. Dr. Caryl Comes recommended for patient to continue wearing his monitor to assess for further arrhythmias. Baseline labs ordered as well as 2-D echo. Labs unremarkable. Echo showed EF 55-60%, nml LV and systolic function. Recent event monitor reports showed intermittent afib with RVR. Dr. Harrington Challenger recommended metoprolol 50mg  to be taken in an as needed bases for afib. Pt scheduled to follow up with Dr. Curt Bears. Pt reports he has not taking the medication yet due to his episodes earlier in the week being mild.  EKG showed sinus rhythm, HR 88, troponin negative. Chest x-ray negative. Labs unremarkable. On reevaluation patient is resting comfortably in bed and reports feeling mildly fatigued, denies any other pain or complaints at this time.  Consulted cardiology. Due to patient's blood pressure being [id number] systolic at baseline, Dr. Oval Linsey advised not to start pt on continued metoprolol but advised to continue taking his prescription of metoprolol as needed for his episode of heart palpitations/A. Fib. Advised pt to follow up with his cardiologist, Dr. Caryl Comes, at his scheduled appointment in 2 days. Discussed results and plan for discharge with patient. Patient has remained hemodynamically stable in the ED. Discussed return precautions.  Final Clinical Impressions(s) / ED Diagnoses   Final diagnoses:  Palpitations  Paroxysmal a-fib [facility])    New Prescriptions New Prescriptions   No medications on file         Nona Dell, [place] 10/04/16 [place], MD 10/12/16 [phone]

## 2016-10-03 NOTE — Discharge Instructions (Signed)
I recommend continuing to take your prescription of metoprolol when you have an episode of heart palpitations. Follow-up with your cardiologist at your scheduled appointment in 2 days. Please return to the Emergency Department if symptoms worsen or new onset of fever, lightheadedness, dizziness, headache, difficulty breathing, chest pain, vomiting, numbness, tingling, weakness, syncope, seizure.

## 2016-10-05 ENCOUNTER — Ambulatory Visit (INDEPENDENT_AMBULATORY_CARE_PROVIDER_SITE_OTHER): Payer: BLUE CROSS/BLUE SHIELD | Admitting: Internal Medicine

## 2016-10-05 ENCOUNTER — Encounter: Payer: Self-pay | Admitting: Internal Medicine

## 2016-10-05 VITALS — BP 106/64 | HR 66 | Ht 69.0 in | Wt 176.0 lb

## 2016-10-05 DIAGNOSIS — I4891 Unspecified atrial fibrillation: Secondary | ICD-10-CM | POA: Diagnosis not present

## 2016-10-05 DIAGNOSIS — I480 Paroxysmal atrial fibrillation: Secondary | ICD-10-CM

## 2016-10-05 MED ORDER — FLECAINIDE ACETATE 50 MG PO TABS: 50.0000 mg | ORAL_TABLET | Freq: Two times a day (BID) | ORAL | 2 refills | Status: DC

## 2016-10-05 MED ORDER — DILTIAZEM HCL ER COATED BEADS 120 MG PO CP24: 120.0000 mg | ORAL_CAPSULE | Freq: Every day | ORAL | 2 refills | Status: DC

## 2016-10-05 NOTE — Progress Notes (Signed)
16 minutes daughter      ELECTROPHYSIOLOGY CONSULT NOTE  Patient ID: Adam Green, MRN: [id number], DOB/AGE: [birth year] 35 y.o. Admit date: (Not on file) Date of Consult: 10/05/2016  Primary Physician: No PCP Per Patient Primary Cardiologist: sk Consulting Physician sk  Chief Complaint: atrial fib   HPI Adam Green is a 35 y.o. male  Seen for atrial fibrillation. He had presented to his primary care physician and an event recorder was obtained that demonstrated atrial fibrillation with modestly rapid rates. There is also some sinus bradycardia and junctional rhythms occurring mostly at night.  He has had significant discombobulated of late. He felt like his heart was pounding out of his chest. He has had no lightheadedness nor presyncope. Most of these events have occurred in the evening. On Monday he had one that occurred at work and took him to the emergency room. His heart rhythm normalized prior to arrival. ECG demonstrated sinus rhythm.  He does not snore. He does have some daytime somnolence. He drinks alcohol but not to excess. He denies marijuana.    10/17 Echo  EF 55-60%  LA normal size TSH CBC and BMET/Mg normal  Past Medical History:  Diagnosis Date  . A-fib [facility])       Surgical History:  Past Surgical History:  Procedure Laterality Date  . FEMUR FRACTURE SURGERY Left   . FINGER SURGERY    . FOREIGN BODY REMOVAL Right 06/16/2016   Procedure: FOREIGN BODY REMOVAL;  Surgeon: Leanora Cover, MD;  Location: [place];  Service: Orthopedics;  Laterality: Right;  . ORIF ANKLE FRACTURE       Home Meds: Prior to Admission medications   Medication Sig Start Date End Date Taking? Authorizing Provider  aspirin 325 MG tablet Take 325 mg by mouth daily as needed for mild pain.    Historical Provider, MD  ibuprofen (ADVIL,MOTRIN) 200 MG tablet Take 400 mg by mouth every 6 (six) hours as needed (pain).     Historical Provider, MD  metoprolol  (LOPRESSOR) 50 MG tablet Take 50 mg by mouth daily as needed (for increased blood pressure or palpitations).    Historical Provider, MD  Multiple Vitamins-Minerals (MULTIVITAMIN WITH MINERALS) tablet Take 1 tablet by mouth daily.    Historical Provider, MD    Allergies:  Allergies  Allergen Reactions  . Dexpak 10 Day [Dexamethasone]     "extra weight gain"    Social History   Social History  . Marital status: Married    Spouse name: N/A  . Number of children: N/A  . Years of education: N/A   Occupational History  . Not on file.   Social History Main Topics  . Smoking status: Former Smoker    Types: Cigarettes    Quit date: 10/26/2006  . Smokeless tobacco: Never Used  . Alcohol use 1.8 oz/week    3 Cans of beer per week     Comment: per week  . Drug use: No  . Sexual activity: Yes    Birth control/ protection: None     Comment: 1 partner   Other Topics Concern  . Not on file   Social History Narrative  . No narrative on file     No family history on file.   ROS:  Please see the history of present illness.     All other systems reviewed and negative.    Physical Exam: Blood pressure 106/64, pulse 66, height 5\' 9"  (1.753 m), weight 176 lb (79.8  kg), SpO2 96 %. General: Well developed, well nourished male in no acute distress. Head: Normocephalic, atraumatic, sclera non-icteric, no xanthomas, nares are without discharge. EENT: normal  Lymph Nodes:  none Neck: Negative for carotid bruits. JVD not elevated. Back:without scoliosis kyphosis  Lungs: Clear bilaterally to auscultation without wheezes, rales, or rhonchi. Breathing is unlabored. Heart: RRR with S1 S2. No    murmur . No rubs, or gallops appreciated. Abdomen: Soft, non-tender, non-distended with normoactive bowel sounds. No hepatomegaly. No rebound/guarding. No obvious abdominal masses. Msk:  Strength and tone appear normal for age. Extremities: No clubbing or cyanosis. No edema.  Distal pedal pulses are  2+ and equal bilaterally. Skin: Warm and Dry Neuro: Alert and oriented X 3. CN III-XII intact Grossly normal sensory and motor function . Psych:  Responds to questions appropriately with a normal affect.      Labs: Cardiac Enzymes No results for input(s): CKTOTAL, CKMB, TROPONINI in the last 72 hours. CBC Lab Results  Component Value Date   WBC 4.5 10/03/2016   HGB 14.7 10/03/2016   HCT 42.9 10/03/2016   MCV 88.6 10/03/2016   PLT 193 10/03/2016   PROTIME: No results for input(s): LABPROT, INR in the last 72 hours. Chemistry   Recent Labs Lab 10/03/16 1106  NA 140  K 4.1  CL 109  CO2 24  BUN 11  CREATININE 0.81  CALCIUM 9.4  GLUCOSE 107*   Lipids No results found for: CHOL, HDL, LDLCALC, TRIG BNP No results found for: PROBNP Thyroid Function Tests: No results for input(s): TSH, T4TOTAL, T3FREE, THYROIDAB in the last 72 hours.  Invalid input(s): FREET3 Miscellaneous No results found for: DDIMER  Radiology/Studies:  Dg Chest 2 View  Result Date: 10/03/2016 CLINICAL DATA:  Chest pain.  Weakness. EXAM: CHEST  2 VIEW COMPARISON:  No prior. FINDINGS: Mediastinum hilar structures normal. Lungs are clear. Heart size normal. No pleural effusion or pneumothorax. No acute bony abnormality . IMPRESSION: No acute cardiopulmonary disease. Electronically Signed   By: Marcello Moores  Register   On: 10/03/2016 11:20    EKG:  Sinus at 66 15/09/37 Otherwise normal  Event recorder reviewed personally A. fib with rapid rates Sinus with frequent PACs and nonsustained atrial tachycardia Junctional rhythm-nocturnal  Assessment and Plan: Paroxysmal atrial fibrillation  Nocturnal sinus node slowing   The patient has significant symptoms of atrial fibrillation. We reviewed the physiology and the thromboembolic risk profile. We discussed strategies of rate versus rhythm control and the need for long-term medications. He is averse to the latter. Hence, we will undertake an approach of     short-term rhythm control with referral for catheter ablation  We will start him on flecainide 50 twice a day and diltiazem 120; he'll take the latter at night as he has a tendency towards low heart rates and low blood pressure  We will hold off on anticoagulation until he has made a decision to pursue catheter ablation as his CHADS-VASc score 0  We'll undertake a sleep study given its impact on atrial fibrillation recurrences with ablation  I have reviewed with him date on alcohol which demonstrates that there is no amount of alcohol it does not increase risk of atrial fibrillation in patients with atrial fibrillation.       Virl Axe

## 2016-10-05 NOTE — Patient Instructions (Addendum)
Medication Instructions: - Your physician has recommended you make the following change in your medication:  1) Start flecainide 50 mg one tablet by mouth twice daily 2) Start diltiazem 120 mg one tablet by mouth once daily at bedtime  Labwork: - none ordered  Procedures/Testing: - Your physician has recommended that you have a sleep study. This test records several body functions during sleep, including: brain activity, eye movement, oxygen and carbon dioxide blood levels, heart rate and rhythm, breathing rate and rhythm, the flow of air through your mouth and nose, snoring, body muscle movements, and chest and belly movement.  Follow-Up: - You have been referred to : Dr. Thompson Grayer- for consideration of an A-fib ablation  Any Additional Special Instructions Will Be Listed Below (If Applicable).     If you need a refill on your cardiac medications before your next appointment, please call your pharmacy.

## 2016-10-07 ENCOUNTER — Telehealth: Payer: Self-pay | Admitting: Internal Medicine

## 2016-10-07 NOTE — Telephone Encounter (Signed)
Follow Up:    She wants to know if pt's sleep study have been scheduled? Also would like for you to call her today before you leave please, also question about his medicine.

## 2016-10-07 NOTE — Telephone Encounter (Signed)
Spoke to patients wife she is aware that the sleep lab will send out the packet and the medication question will be sent to triage

## 2016-10-07 NOTE — Telephone Encounter (Signed)
Spoke with casey, questions regarding flecainide and diltiazem answered.

## 2016-10-10 ENCOUNTER — Telehealth: Payer: Self-pay | Admitting: Internal Medicine

## 2016-10-10 ENCOUNTER — Telehealth: Payer: Self-pay | Admitting: Nurse Practitioner

## 2016-10-10 MED ORDER — PROPAFENONE HCL 150 MG PO TABS: 150.0000 mg | ORAL_TABLET | Freq: Three times a day (TID) | ORAL | 11 refills | Status: DC

## 2016-10-10 NOTE — Telephone Encounter (Signed)
Spoke with patient who states he has a funny feeling in his chest, fatigue, dizziness, and overall feeling of fogginess and that he is not himself.  He associates these symptoms with starting flecainide on 11/1 at office visit with Dr. Caryl Comes  Complains of fatigue and feeling terrible. States he has good appetite, drinks some water but admits it is probably not enough for the type of work he does.   Has not started diltiazem; states Dr. Caryl Comes told him he could wait to start this medication to see how he feels on flecainide. Denies hx of anxiety but admits he has been anxious since finding out he has afib.   States has been awakened by fast heart rate in the mornings and when he gets up, he feels very nervous. I advised that Dr. Caryl Comes is out of the office until Wednesday but I will discuss with one of his colleagues.  He asks if Dr. Rayann Heman is in the office today and I confirmed.  I advised I will discuss with Dr. Rayann Heman and call back with his advice.  He verbalized understanding and agreement with plan.

## 2016-10-10 NOTE — Telephone Encounter (Signed)
Patient called back because he is concerned that sleep study is not scheduled prior to ablation.  He states Dr. Caryl Comes stressed that this test be done prior to the ablation.  I explained that untreated sleep apnea would cause likelihood of a fib to return after ablation.  I advised that our office sleep coordinator advised he call the Sleep Center to reschedule and/or be added to their cancellation list.  I gave him their phone number @ [phone] and advised he call to reschedule.  He verbalized understanding and agreement

## 2016-10-10 NOTE — Telephone Encounter (Signed)
New Message  Pt call requesting to speak with RN about a medication he is having a reaction to. Pt does not know the name of the medication he is speaking of. Pt did say med name states with a T. Please call back to discuss

## 2016-10-10 NOTE — Telephone Encounter (Signed)
Notified patient that per Janan Halter, RN, sleep study can occur after the ablation.  He thanked me for the call.

## 2016-10-10 NOTE — Telephone Encounter (Signed)
New message       Wife request to talk to the nurse again.

## 2016-10-10 NOTE — Telephone Encounter (Signed)
Spoke with patient and reviewed Dr. Jackalyn Lombard advice that patient can d/c flecainide and start Rythmol either 150 mg TID or 225 mg BID.  I advised he will need to wait 24 hours after last dose of flecainide before starting Rythmol. Dr. Rayann Heman also had his nurse, Janan Halter, RN give me possible dates for afib ablation so that he can go ahead and get on the schedule.  Patient prefers December 15 for ablation.  He verbalized agreement to stop flecainide and start rythmol 150 mg tid.  He is aware of appointment with Dr. Rayann Heman on 11/20.  I advised him to call back with questions or concerns prior to that appointment.  He verbalized understanding and agreement and thanked me for the call.

## 2016-10-12 NOTE — Telephone Encounter (Signed)
Patient has a consult with Dr. Rayann Heman to discuss ablation on 10/24/16. His sleep study is 11/01/16.

## 2016-10-14 ENCOUNTER — Telehealth: Payer: Self-pay | Admitting: Internal Medicine

## 2016-10-14 NOTE — Telephone Encounter (Signed)
Pt wife is calling have questions regarding his current medications. Also wants to know if sleep study is covered by insurance and when will his ablation be?

## 2016-10-14 NOTE — Telephone Encounter (Signed)
I called and spoke with the patient's wife (DPR). She states the patient was not feeling well on Monday with the flecainide- fatigue. Dr. Rayann Heman was consulted by triage with recommendations  Orders received to have the patient stop flecainide if not tolerating and start propafenone 150 mg every 8 hours (start 24 hours after last dose of flecainide). Per the patient's wife, he has not picked up propafenone as he is feeling better on flecainide. He will continue this for now, but is aware propafenone is at the pharmacy for pick up if needed. She is aware that his ablation is set for 11/18/16. She also inquired if Sleep study is covered by insurance. I advised her I am not sure about this-will defer to Nina,CMA (sleep).

## 2016-10-19 ENCOUNTER — Telehealth: Payer: Self-pay | Admitting: Internal Medicine

## 2016-10-19 MED ORDER — FLECAINIDE ACETATE 50 MG PO TABS: 50.0000 mg | ORAL_TABLET | Freq: Two times a day (BID) | ORAL | 3 refills | Status: DC

## 2016-10-19 NOTE — Telephone Encounter (Signed)
Pt's wife called to report pt is still taking Fleconide???     Pt still feels bad and can not fuction.  Pt stated he is going to STOP taking it.     Please give them a call about this medication.

## 2016-10-19 NOTE — Telephone Encounter (Signed)
Spoke with patient's wife who states patient continues to have side effects he believes are related to the flecainide including headache, fatigue, insomnia, and generally feeling poor.  She reports he has not started rythmol because he thinks it will cause the same side effects.  Patient wants to stop the flecainide. He does not want to start flecainide.  I advised that if patient d/c flecainide, he should monitor for episodes of atrial fib, especially if heart rate is fast.  I advised that he seek medical attention if he notices SOB, dizziness, light-headedness, or heart rate greater than 120 bpm for a significant time.  She states the patient recently had heart rate of 160 bpm and it lasted 2 hours and he did not seek medical attention.  I advised that he should call 911 if this occurs again because it can cause adverse effects and damage to the heart for heart rate to remain fast for an extended time.  She states she will advise patient to reduce dose to 1/2 tab twice daily to see if he can tolerate.  I advised her to call back with questions or concerns prior to appointment Monday 11/20 with Dr. Rayann Heman.

## 2016-10-24 ENCOUNTER — Ambulatory Visit (INDEPENDENT_AMBULATORY_CARE_PROVIDER_SITE_OTHER): Payer: BLUE CROSS/BLUE SHIELD | Admitting: Internal Medicine

## 2016-10-24 ENCOUNTER — Encounter: Payer: Self-pay | Admitting: Internal Medicine

## 2016-10-24 VITALS — BP 100/68 | HR 60 | Ht 69.5 in | Wt 177.4 lb

## 2016-10-24 DIAGNOSIS — I480 Paroxysmal atrial fibrillation: Secondary | ICD-10-CM

## 2016-10-24 MED ORDER — RIVAROXABAN 20 MG PO TABS: 20.0000 mg | ORAL_TABLET | Freq: Every day | ORAL | 11 refills | Status: DC

## 2016-10-24 NOTE — Patient Instructions (Addendum)
Medication Instructions:  Your physician has recommended you make the following change in your medication:  1) Start Xarelto 20 mg daily    Labwork: Your physician recommends that you return for lab work on 11/11/16   Testing/Procedures:  Your physician has requested that you have cardiac CT. Cardiac computed tomography (CT) is a painless test that uses an x-ray machine to take clear, detailed pictures of your heart. For further information please visit [email or web address]. Please follow instruction sheet as given---week of [id number] pre-certified with insurance will call with date and instructions  Your physician has recommended that you have an ablation. Catheter ablation is a medical procedure used to treat some cardiac arrhythmias (irregular heartbeats). During catheter ablation, a long, thin, flexible tube is put into a blood vessel in your groin (upper thigh), or neck. This tube is called an ablation catheter. It is then guided to your heart through the blood vessel. Radio frequency waves destroy small areas of heart tissue where abnormal heartbeats may cause an arrhythmia to start. Please see the instruction sheet given to you today.----11/18/16   Please arrive at [facility] at 5:30am Do not eat or drink after midnight the night prior to the procedure Do not take any medications the morning of the procedure Plan for one night stay Will need someone to drive you home after discharge      Follow-Up:   Your physician recommends that you schedule a follow-up appointment in: 4 weeks from 11/18/16 with Roderic Palau, NP and 3 months from 11/18/16 with Dr Rayann Heman   Any Other Special Instructions Will Be Listed Below (If Applicable).     If you need a refill on your cardiac medications before your next appointment, please call your pharmacy.

## 2016-10-24 NOTE — Progress Notes (Signed)
Electrophysiology Office Note   Date:  10/24/2016   ID:  Adam, Green [birth year], MRN [id number]  PCP:  Lynne Logan, MD  Primary Electrophysiologist: Dr Caryl Comes  Chief Complaint  Patient presents with  . Atrial Fibrillation     History of Present Illness: Adam Green is a 34 y.o. male who presents today for electrophysiology follow-up.   He was recently seen by Dr Caryl Comes.  He has had afib for about a year.  Episodes have increased in frequency and duration.  Currently, he has afib about once per week.  He was recently evaluated by Dr Caryl Comes and initiated on flecainide.  He is not tolerating this medicine due to restlessness at night and headaches.  He has significantly reduced ETOH.  Today, he denies symptoms of chest pain, shortness of breath, orthopnea, PND, lower extremity edema, claudication, dizziness, presyncope, syncope, bleeding, or neurologic sequela. The patient is tolerating medications without difficulties and is otherwise without complaint today.    Past Medical History:  Diagnosis Date  . A-fib [facility])    Past Surgical History:  Procedure Laterality Date  . FEMUR FRACTURE SURGERY Left   . FINGER SURGERY    . FOREIGN BODY REMOVAL Right 06/16/2016   Procedure: FOREIGN BODY REMOVAL;  Surgeon: Leanora Cover, MD;  Location: [place];  Service: Orthopedics;  Laterality: Right;  . ORIF ANKLE FRACTURE       Current Outpatient Prescriptions  Medication Sig Dispense Refill  . flecainide (TAMBOCOR) 50 MG tablet Take 1 tablet (50 mg total) by mouth 2 (two) times daily. 180 tablet 3  . ibuprofen (ADVIL,MOTRIN) 200 MG tablet Take 400 mg by mouth every 6 (six) hours as needed (pain).     . metoprolol (LOPRESSOR) 50 MG tablet Take 50 mg by mouth daily as needed (for increased blood pressure or palpitations).    . Multiple Vitamins-Minerals (MULTIVITAMIN WITH MINERALS) tablet Take 1 tablet by mouth daily.    . rivaroxaban (XARELTO) 20 MG TABS  tablet Take 1 tablet (20 mg total) by mouth daily with supper. 30 tablet 11   No current facility-administered medications for this visit.     Allergies:   Dexpak 10 day [dexamethasone]   Social History:  The patient  reports that he quit smoking about 10 years ago. His smoking use included Cigarettes. He has never used smokeless tobacco. He reports that he drinks about 1.8 oz of alcohol per week . He reports that he does not use drugs.   Family History:  The patient denies FH of early AF   ROS:  Please see the history of present illness.   All other systems are reviewed and negative.    PHYSICAL EXAM: VS:  BP 100/68   Pulse 60   Ht 5' 9.5" (1.765 m)   Wt 177 lb 6.4 oz (80.5 kg)   LMP  (Exact Date)   BMI 25.82 kg/m  , BMI Body mass index is 25.82 kg/m. GEN: Well nourished, well developed, in no acute distress  HEENT: normal  Neck: no JVD, carotid bruits, or masses Cardiac: RRR; no murmurs, rubs, or gallops,no edema  Respiratory:  clear to auscultation bilaterally, normal work of breathing GI: soft, nontender, nondistended, + BS MS: no deformity or atrophy  Skin: warm and dry  Neuro:  Strength and sensation are intact Psych: euthymic mood, full affect  EKG:  EKG is ordered today. The ekg ordered today shows sinus rhythm 60 bpm, PR 166 msec, QRS 96 msec, otherwise  normal ekg   Recent Labs: 09/02/2016: Magnesium 2.1; TSH 1.23 10/03/2016: BUN 11; Creatinine, Ser 0.81; Hemoglobin 14.7; Platelets 193; Potassium 4.1; Sodium 140    Lipid Panel  No results found for: CHOL, TRIG, HDL, CHOLHDL, VLDL, LDLCALC, LDLDIRECT   Wt Readings from Last 3 Encounters:  10/24/16 177 lb 6.4 oz (80.5 kg)  10/05/16 176 lb (79.8 kg)  10/03/16 170 lb (77.1 kg)      Other studies Reviewed: Additional studies/ records that were reviewed today include: Dr Aquilla Hacker notes, prior echo  Review of the above records today demonstrates: as above   ASSESSMENT AND PLAN:  1.  Paroxysmal atrial  fibrillation The patient has very symptomatic recurrent PAF.  He has failed medical therapy with flecainide due to intolerance.  Therapeutic strategies for afib including medicine and ablation were discussed in detail with the patient today. Risk, benefits, and alternatives to EP study and radiofrequency ablation for afib were also discussed in detail today. These risks include but are not limited to stroke, bleeding, vascular damage, tamponade, perforation, damage to the esophagus, lungs, and other structures, pulmonary vein stenosis, worsening renal function, and death. The patient understands these risk and wishes to proceed.  We will therefore proceed with catheter ablation once the patient has been adequately anticoagulated.  Xarelto 20mg  daily is initiated at this time.  Will obtain cardiac CT prior to ablation to exclude LAA thrombus.  Today, I have spent 40 minutes with the patient discussing atrial fibrillation .  More than 50% of the visit time today was spent on this issue.    Current medicines are reviewed at length with the patient today.   The patient does not have concerns regarding his medicines.  The following changes were made today:  none  Labs/ tests ordered today include:  Orders Placed This Encounter  Procedures  . CT CARDIAC MORPH/PULM VEIN W/CM&W/O CA SCORE  . Basic metabolic panel  . CBC with Differential  . EKG 12-Lead     Signed, Thompson Grayer, MD  10/24/2016 10:47 PM     [address] [address] [place] [address] [phone] (office) [phone] (fax)

## 2016-11-01 ENCOUNTER — Ambulatory Visit (HOSPITAL_BASED_OUTPATIENT_CLINIC_OR_DEPARTMENT_OTHER): Payer: BLUE CROSS/BLUE SHIELD | Attending: Internal Medicine | Admitting: Cardiology

## 2016-11-01 ENCOUNTER — Encounter: Payer: Self-pay | Admitting: Internal Medicine

## 2016-11-01 VITALS — Ht 69.0 in | Wt 170.0 lb

## 2016-11-01 DIAGNOSIS — I480 Paroxysmal atrial fibrillation: Secondary | ICD-10-CM | POA: Diagnosis not present

## 2016-11-07 ENCOUNTER — Other Ambulatory Visit (INDEPENDENT_AMBULATORY_CARE_PROVIDER_SITE_OTHER): Payer: BLUE CROSS/BLUE SHIELD

## 2016-11-07 ENCOUNTER — Other Ambulatory Visit: Payer: BLUE CROSS/BLUE SHIELD

## 2016-11-07 ENCOUNTER — Telehealth: Payer: Self-pay | Admitting: Internal Medicine

## 2016-11-07 DIAGNOSIS — I480 Paroxysmal atrial fibrillation: Secondary | ICD-10-CM

## 2016-11-07 LAB — BASIC METABOLIC PANEL
BUN: 13 mg/dL (ref 7–25)
CHLORIDE: 105 mmol/L (ref 98–110)
CO2: 25 mmol/L (ref 20–31)
Calcium: 9.6 mg/dL (ref 8.6–10.3)
Creat: 0.87 mg/dL (ref 0.60–1.35)
Glucose, Bld: 93 mg/dL (ref 65–99)
POTASSIUM: 4 mmol/L (ref 3.5–5.3)
Sodium: 140 mmol/L (ref 135–146)

## 2016-11-07 LAB — CBC WITH DIFFERENTIAL/PLATELET
BASOS ABS: 51 {cells}/uL (ref 0–200)
BASOS PCT: 1 %
EOS ABS: 153 {cells}/uL (ref 15–500)
Eosinophils Relative: 3 %
HEMATOCRIT: 43.3 % (ref 38.5–50.0)
HEMOGLOBIN: 14.4 g/dL (ref 13.2–17.1)
LYMPHS ABS: 1428 {cells}/uL (ref 850–3900)
Lymphocytes Relative: 28 %
MCH: 29.7 pg (ref 27.0–33.0)
MCHC: 33.3 g/dL (ref 32.0–36.0)
MCV: 89.3 fL (ref 80.0–100.0)
MONO ABS: 408 {cells}/uL (ref 200–950)
MONOS PCT: 8 %
MPV: 9.3 fL (ref 7.5–12.5)
NEUTROS ABS: 3060 {cells}/uL (ref 1500–7800)
Neutrophils Relative %: 60 %
PLATELETS: 224 10*3/uL (ref 140–400)
RBC: 4.85 MIL/uL (ref 4.20–5.80)
RDW: 12.6 % (ref 11.0–15.0)
WBC: 5.1 10*3/uL (ref 3.8–10.8)

## 2016-11-07 LAB — LIPID PANEL
CHOL/HDL RATIO: 2.8 ratio (ref <5.0)
Cholesterol: 142 mg/dL (ref <200)
HDL: 51 mg/dL (ref >40)
LDL Cholesterol: 82 mg/dL (ref <100)
Triglycerides: 43 mg/dL (ref <150)
VLDL: 9 mg/dL (ref <30)

## 2016-11-07 NOTE — Telephone Encounter (Signed)
New Message:     Pt having blood work today,can he get his cholesterol checked today  Please?

## 2016-11-07 NOTE — Telephone Encounter (Signed)
Order placed and will forward to PCP

## 2016-11-14 ENCOUNTER — Encounter (HOSPITAL_COMMUNITY): Payer: Self-pay

## 2016-11-14 ENCOUNTER — Ambulatory Visit (HOSPITAL_COMMUNITY)
Admission: RE | Admit: 2016-11-14 | Discharge: 2016-11-14 | Disposition: A | Discharge status: Home / Self Care | Payer: BLUE CROSS/BLUE SHIELD | Source: Ambulatory Visit | Attending: Internal Medicine | Admitting: Internal Medicine

## 2016-11-14 DIAGNOSIS — I4891 Unspecified atrial fibrillation: Secondary | ICD-10-CM | POA: Diagnosis not present

## 2016-11-14 DIAGNOSIS — R911 Solitary pulmonary nodule: Secondary | ICD-10-CM | POA: Insufficient documentation

## 2016-11-14 DIAGNOSIS — I480 Paroxysmal atrial fibrillation: Secondary | ICD-10-CM | POA: Diagnosis not present

## 2016-11-14 MED ORDER — NITROGLYCERIN 0.4 MG SL SUBL
SUBLINGUAL_TABLET | SUBLINGUAL | Status: AC
  Filled 2016-11-14: qty 1

## 2016-11-14 MED ORDER — IOPAMIDOL (ISOVUE-370) INJECTION 76%
INTRAVENOUS | Status: AC
  Administered 2016-11-14: 80 mL
  Filled 2016-11-14: qty 100

## 2016-11-14 MED ORDER — NITROGLYCERIN 0.4 MG SL SUBL
SUBLINGUAL_TABLET | SUBLINGUAL | Status: AC
  Administered 2016-11-14: 12:00:00
  Filled 2016-11-14: qty 1

## 2016-11-14 MED ORDER — IOPAMIDOL (ISOVUE-300) INJECTION 61%
INTRAVENOUS | Status: AC
  Filled 2016-11-14: qty 100

## 2016-11-17 ENCOUNTER — Telehealth: Payer: Self-pay | Admitting: Internal Medicine

## 2016-11-17 NOTE — Telephone Encounter (Signed)
New message   Adam Green is calling she verbalized she needs to know the results of the CT  What are the surgery instruction and medications that pt can and cannot take

## 2016-11-17 NOTE — Telephone Encounter (Signed)
  Spoke with wife and let her know Dr Rayann Heman will talk with them tomorrow prior to the procedure.  I can not see the interpretation for sleep study but Dr Rayann Heman can go over

## 2016-11-18 ENCOUNTER — Ambulatory Visit (HOSPITAL_COMMUNITY): Payer: BLUE CROSS/BLUE SHIELD | Admitting: Anesthesiology

## 2016-11-18 ENCOUNTER — Encounter (HOSPITAL_COMMUNITY)
Admission: RE | Disposition: A | Discharge status: Home / Self Care | Payer: Self-pay | Source: Ambulatory Visit | Attending: Internal Medicine

## 2016-11-18 ENCOUNTER — Encounter (HOSPITAL_COMMUNITY): Payer: Self-pay | Admitting: Internal Medicine

## 2016-11-18 ENCOUNTER — Ambulatory Visit (HOSPITAL_COMMUNITY)
Admission: RE | Admit: 2016-11-18 | Discharge: 2016-11-18 | Disposition: A | Discharge status: Home / Self Care | Payer: BLUE CROSS/BLUE SHIELD | Source: Ambulatory Visit | Attending: Internal Medicine | Admitting: Internal Medicine

## 2016-11-18 DIAGNOSIS — Z888 Allergy status to other drugs, medicaments and biological substances status: Secondary | ICD-10-CM | POA: Insufficient documentation

## 2016-11-18 DIAGNOSIS — I4891 Unspecified atrial fibrillation: Secondary | ICD-10-CM | POA: Diagnosis present

## 2016-11-18 DIAGNOSIS — Z87891 Personal history of nicotine dependence: Secondary | ICD-10-CM | POA: Insufficient documentation

## 2016-11-18 DIAGNOSIS — Z7901 Long term (current) use of anticoagulants: Secondary | ICD-10-CM | POA: Diagnosis not present

## 2016-11-18 DIAGNOSIS — I480 Paroxysmal atrial fibrillation: Secondary | ICD-10-CM | POA: Insufficient documentation

## 2016-11-18 HISTORY — PX: ELECTROPHYSIOLOGIC STUDY: SHX172A

## 2016-11-18 LAB — POCT ACTIVATED CLOTTING TIME
ACTIVATED CLOTTING TIME: 153 s
Activated Clotting Time: 301 seconds
Activated Clotting Time: 290 seconds
Activated Clotting Time: 318 seconds

## 2016-11-18 SURGERY — ATRIAL FIBRILLATION ABLATION
Anesthesia: General

## 2016-11-18 MED ORDER — SODIUM CHLORIDE 0.9 % IV SOLN: 250.0000 mL | INTRAVENOUS | Status: DC | PRN

## 2016-11-18 MED ORDER — PANTOPRAZOLE SODIUM 40 MG PO TBEC: 40.0000 mg | DELAYED_RELEASE_TABLET | Freq: Every day | ORAL | 0 refills | Status: DC

## 2016-11-18 MED ORDER — BUPIVACAINE HCL (PF) 0.25 % IJ SOLN
INTRAMUSCULAR | Status: DC | PRN
  Administered 2016-11-18: 30 mL

## 2016-11-18 MED ORDER — SODIUM CHLORIDE 0.9 % IV SOLN
INTRAVENOUS | Status: DC
  Administered 2016-11-18 (×2): via INTRAVENOUS

## 2016-11-18 MED ORDER — IOPAMIDOL (ISOVUE-370) INJECTION 76%
INTRAVENOUS | Status: DC | PRN
  Administered 2016-11-18: 3 mL via INTRAVENOUS

## 2016-11-18 MED ORDER — LIDOCAINE HCL (CARDIAC) 20 MG/ML IV SOLN
INTRAVENOUS | Status: DC | PRN
  Administered 2016-11-18: 40 mg via INTRAVENOUS

## 2016-11-18 MED ORDER — HEPARIN SODIUM (PORCINE) 1000 UNIT/ML IJ SOLN
INTRAMUSCULAR | Status: DC | PRN
  Administered 2016-11-18 (×2): 1000 [IU] via INTRAVENOUS
  Administered 2016-11-18: 12000 [IU] via INTRAVENOUS

## 2016-11-18 MED ORDER — SODIUM CHLORIDE 0.9% FLUSH: 3.0000 mL | Freq: Two times a day (BID) | INTRAVENOUS | Status: DC

## 2016-11-18 MED ORDER — ACETAMINOPHEN 325 MG PO TABS
650.0000 mg | ORAL_TABLET | ORAL | Status: DC | PRN
  Administered 2016-11-18: 650 mg via ORAL
  Filled 2016-11-18: qty 2

## 2016-11-18 MED ORDER — SODIUM CHLORIDE 0.9% FLUSH: 3.0000 mL | INTRAVENOUS | Status: DC | PRN

## 2016-11-18 MED ORDER — ISOPROTERENOL HCL 0.2 MG/ML IJ SOLN
INTRAMUSCULAR | Status: AC
  Filled 2016-11-18: qty 5

## 2016-11-18 MED ORDER — MIDAZOLAM HCL 5 MG/5ML IJ SOLN
INTRAMUSCULAR | Status: DC | PRN
  Administered 2016-11-18 (×2): 1 mg via INTRAVENOUS

## 2016-11-18 MED ORDER — HEPARIN SODIUM (PORCINE) 1000 UNIT/ML IJ SOLN
INTRAMUSCULAR | Status: AC
  Filled 2016-11-18: qty 1

## 2016-11-18 MED ORDER — PROTAMINE SULFATE 10 MG/ML IV SOLN
INTRAVENOUS | Status: DC | PRN
  Administered 2016-11-18: 30 mg via INTRAVENOUS

## 2016-11-18 MED ORDER — ONDANSETRON HCL 4 MG/2ML IJ SOLN: 4.0000 mg | Freq: Four times a day (QID) | INTRAMUSCULAR | Status: DC | PRN

## 2016-11-18 MED ORDER — IOPAMIDOL (ISOVUE-370) INJECTION 76%
INTRAVENOUS | Status: AC
  Filled 2016-11-18: qty 50

## 2016-11-18 MED ORDER — DEXTROSE 5 % IV SOLN
INTRAVENOUS | Status: DC | PRN
  Administered 2016-11-18: 20 ug/min via INTRAVENOUS

## 2016-11-18 MED ORDER — FENTANYL CITRATE (PF) 100 MCG/2ML IJ SOLN
INTRAMUSCULAR | Status: DC | PRN
  Administered 2016-11-18: 25 ug via INTRAVENOUS
  Administered 2016-11-18: 50 ug via INTRAVENOUS
  Administered 2016-11-18 (×5): 25 ug via INTRAVENOUS

## 2016-11-18 MED ORDER — BUPIVACAINE HCL (PF) 0.25 % IJ SOLN
INTRAMUSCULAR | Status: AC
  Filled 2016-11-18: qty 30

## 2016-11-18 MED ORDER — HYDROCODONE-ACETAMINOPHEN 5-325 MG PO TABS
ORAL_TABLET | ORAL | Status: AC
  Filled 2016-11-18: qty 2

## 2016-11-18 MED ORDER — RIVAROXABAN 20 MG PO TABS: 20.0000 mg | ORAL_TABLET | Freq: Every day | ORAL | Status: DC

## 2016-11-18 MED ORDER — HYDROCODONE-ACETAMINOPHEN 5-325 MG PO TABS
1.0000 | ORAL_TABLET | ORAL | Status: DC | PRN
  Administered 2016-11-18: 2 via ORAL

## 2016-11-18 MED ORDER — HEPARIN SODIUM (PORCINE) 1000 UNIT/ML IJ SOLN
INTRAMUSCULAR | Status: DC | PRN
  Administered 2016-11-18: 1000 [IU] via INTRAVENOUS
  Administered 2016-11-18: 3000 [IU] via INTRAVENOUS

## 2016-11-18 MED ORDER — PHENYLEPHRINE HCL 10 MG/ML IJ SOLN
INTRAVENOUS | Status: DC | PRN
  Administered 2016-11-18: 10 ug/min via INTRAVENOUS

## 2016-11-18 MED ORDER — PROPOFOL 10 MG/ML IV BOLUS
INTRAVENOUS | Status: DC | PRN
  Administered 2016-11-18: 200 mg via INTRAVENOUS

## 2016-11-18 SURGICAL SUPPLY — 18 items
BAG SNAP BAND KOVER 36X36 (MISCELLANEOUS) ×3 IMPLANT
BLANKET WARM UNDERBOD FULL ACC (MISCELLANEOUS) ×3 IMPLANT
CATH NAVISTAR SMARTTOUCH DF (ABLATOR) ×3 IMPLANT
CATH SOUNDSTAR 3D IMAGING (CATHETERS) ×3 IMPLANT
CATH VARIABLE LASSO NAV 2515 (CATHETERS) ×3 IMPLANT
CATH WEBSTER BI DIR CS D-F CRV (CATHETERS) ×3 IMPLANT
COVER SWIFTLINK CONNECTOR (BAG) ×3 IMPLANT
NEEDLE TRANSEP BRK 71CM 407200 (NEEDLE) ×3 IMPLANT
PACK EP LATEX FREE (CUSTOM PROCEDURE TRAY) ×2
PACK EP LF (CUSTOM PROCEDURE TRAY) ×1 IMPLANT
PAD DEFIB LIFELINK (PAD) ×3 IMPLANT
PATCH CARTO3 (PAD) ×3 IMPLANT
SHEATH AVANTI 11F 11CM (SHEATH) ×3 IMPLANT
SHEATH PINNACLE 7F 10CM (SHEATH) ×6 IMPLANT
SHEATH PINNACLE 9F 10CM (SHEATH) ×3 IMPLANT
SHEATH SWARTZ TS SL2 63CM 8.5F (SHEATH) ×3 IMPLANT
SHIELD RADPAD SCOOP 12X17 (MISCELLANEOUS) ×3 IMPLANT
TUBING SMART ABLATE COOLFLOW (TUBING) ×3 IMPLANT

## 2016-11-18 NOTE — Discharge Summary (Signed)
ELECTROPHYSIOLOGY PROCEDURE DISCHARGE SUMMARY    Patient ID: Adam Green,  MRN: [id number], DOB/AGE: Sep 22, 1981 35 y.o.  Admit date: 11/18/2016 Discharge date: 11/18/16  Primary Care Physician: Lynne Logan, MD  Primary Cardiologist/Electrophysiologist:  Dr. Caryl Comes  Primary Discharge Diagnosis:  1. Paroxysmal AFib     CHA2DS2Vasc is zero, on Xarelto peri-procedure    Procedures This Admission:  1.  Electrophysiology study and radiofrequency catheter ablation on 11/18/16 by Dr Thompson Grayer.   This study demonstrated  CONCLUSIONS: 1. Sinus rhythm upon presentation.   2. 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  The RSPV and RIPV were moderate sized.   3. Successful electrical isolation and anatomical encircling of all four pulmonary veins with radiofrequency current. The RSPV was felt to be the culpril for afib. 4. No inducible arrhythmias following ablation both on and off of Isuprel 5. No early apparent complications.  Brief HPI: Adam Green is a 35 y.o. male with a history of paroxysmal atrial fibrillation.  They have failed medical therapy with Flecainide. Risks, benefits, and alternatives to catheter ablation of atrial fibrillation were reviewed with the patient who wished to proceed.  The patient underwent cardiac CT prior to the procedure which demonstrated no LAA thrombus.    Hospital Course:  The patient was admitted and underwent EPS/RFCA of atrial fibrillation with details as outlined above.  He was monitored on telemetry which demonstrated sinus rhythm.  Groin sites were without complication on the day of discharge.  The patient was examined by Dr. Rayann Heman and considered to be stable for discharge.  Wound care and restrictions were reviewed with the patient.  The patient will be seen back by Roderic Palau, NP in 4 weeks and Dr Rayann Heman in 12 weeks for post ablation follow up.    Physical Exam: Vitals:   11/18/16 1205 11/18/16  1220 11/18/16 1235 11/18/16 1250  BP: 116/80 112/84 117/79 113/86  Pulse: 91 90 91 93  Resp:      Temp:      TempSrc:      SpO2: 100% 100% 97% 94%  Weight:      Height:         GEN- The patient is well appearing, alert and oriented x 3 today.   HEENT: normocephalic, atraumatic; sclera clear, conjunctiva pink; hearing intact; oropharynx clear; neck supple  Lungs- Clear to ausculation bilaterally, normal work of breathing.  No wheezes, rales, rhonchi Heart-   Regular rate and rhythm, no murmurs, rubs or gallops  GI- soft, non-tender, non-distended, bowel sounds present  Extremities- no clubbing, cyanosis, or edema; DP/PT/radial pulses 2+ bilaterally, groin without hematoma/bruit MS- no significant deformity or atrophy Skin- warm and dry, no rash or lesion Psych- euthymic mood, full affect Neuro- strength and sensation are intact   Labs:   Lab Results  Component Value Date   WBC 5.1 11/07/2016   HGB 14.4 11/07/2016   HCT 43.3 11/07/2016   MCV 89.3 11/07/2016   PLT 224 11/07/2016   No results for input(s): NA, K, CL, CO2, BUN, CREATININE, CALCIUM, PROT, BILITOT, ALKPHOS, ALT, AST, GLUCOSE in the last 168 hours.  Invalid input(s): LABALBU   Discharge Medications:  Allergies as of 11/18/2016      Reactions   Dexpak 10 Day [dexamethasone]    "extra weight gain"      Medication List    STOP taking these medications   flecainide 50 MG tablet Commonly known as:  TAMBOCOR   metoprolol  50 MG tablet Commonly known as:  LOPRESSOR     TAKE these medications   acetaminophen 500 MG tablet Commonly known as:  TYLENOL Take 1,000 mg by mouth every 6 (six) hours as needed for headache.   diphenhydrAMINE 25 mg capsule Commonly known as:  BENADRYL Take 25-50 mg by mouth at bedtime as needed for sleep (depends on difficulty going to sleep if takes 25-50 mg).   multivitamin with minerals tablet Take 1 tablet by mouth daily.   pantoprazole 40 MG tablet Commonly known as:   PROTONIX Take 1 tablet (40 mg total) by mouth daily.   rivaroxaban 20 MG Tabs tablet Commonly known as:  XARELTO Take 1 tablet (20 mg total) by mouth daily with supper.       Disposition:  Home  Follow-up Information    [facility] Follow up on 12/19/2016.   Specialty:  Cardiology Why:  11:30AM Contact information: [address] [id number] [place] [phone]       Thompson Grayer, MD Follow up on 02/24/2017.   Specialty:  Cardiology Why:  10:00AM Contact information: [place] [address] [phone]           Duration of Discharge Encounter: Greater than 30 minutes including physician time.  Army Fossa MD 11/18/2016 3:57 PM

## 2016-11-18 NOTE — Anesthesia Preprocedure Evaluation (Addendum)
Anesthesia Evaluation  Patient identified by MRN, date of birth, ID band Patient awake    Reviewed: Allergy & Precautions, NPO status , Patient's Chart, lab work & pertinent test results  Airway Mallampati: II  TM Distance: >3 FB Neck ROM: Full    Dental  (+) Dental Advisory Given   Pulmonary former smoker,    breath sounds clear to auscultation       Cardiovascular negative cardio ROS   Rhythm:Regular Rate:Normal     Neuro/Psych negative neurological ROS     GI/Hepatic negative GI ROS, Neg liver ROS,   Endo/Other  negative endocrine ROS  Renal/GU negative Renal ROS     Musculoskeletal   Abdominal   Peds  Hematology negative hematology ROS (+)   Anesthesia Other Findings   Reproductive/Obstetrics                            Lab Results  Component Value Date   WBC 5.1 11/07/2016   HGB 14.4 11/07/2016   HCT 43.3 11/07/2016   MCV 89.3 11/07/2016   PLT 224 11/07/2016   Lab Results  Component Value Date   CREATININE 0.87 11/07/2016   BUN 13 11/07/2016   NA 140 11/07/2016   K 4.0 11/07/2016   CL 105 11/07/2016   CO2 25 11/07/2016    Anesthesia Physical Anesthesia Plan  ASA: II  Anesthesia Plan: General   Post-op Pain Management:    Induction: Intravenous  Airway Management Planned: LMA  Additional Equipment:   Intra-op Plan:   Post-operative Plan: Extubation in OR  Informed Consent: I have reviewed the patients History and Physical, chart, labs and discussed the procedure including the risks, benefits and alternatives for the proposed anesthesia with the patient or authorized representative who has indicated his/her understanding and acceptance.     Plan Discussed with: CRNA  Anesthesia Plan Comments:         Anesthesia Quick Evaluation

## 2016-11-18 NOTE — Interval H&P Note (Signed)
History and Physical Interval Note:  11/18/2016 7:24 AM  Adam Green  has presented today for surgery, with the diagnosis of AFIB  The various methods of treatment have been discussed with the patient and family. After consideration of risks, benefits and other options for treatment, the patient has consented to  Procedure(s): Atrial Fibrillation Ablation (N/A) as a surgical intervention .  The patient's history has been reviewed, patient examined, no change in status, stable for surgery.  I have reviewed the patient's chart and labs.  Questions were answered to the patient's satisfaction.     Thompson Grayer

## 2016-11-18 NOTE — Anesthesia Procedure Notes (Signed)
Procedure Name: LMA Insertion Date/Time: 11/18/2016 7:50 AM Performed by: Izora Gala Pre-anesthesia Checklist: Patient identified, Emergency Drugs available, Suction available and Patient being monitored Patient Re-evaluated:Patient Re-evaluated prior to inductionOxygen Delivery Method: Circle system utilized Preoxygenation: Pre-oxygenation with 100% oxygen Intubation Type: IV induction Ventilation: Mask ventilation without difficulty LMA Size: 5.0 Number of attempts: 1 Placement Confirmation: ETT inserted through vocal cords under direct vision,  positive ETCO2 and breath sounds checked- equal and bilateral Tube secured with: Tape Dental Injury: Teeth and Oropharynx as per pre-operative assessment

## 2016-11-18 NOTE — Progress Notes (Signed)
Site area: right groin Site Prior to Removal:  Level 0 Pressure Applied For: 20 minutes Manual:   yes Patient Status During Pull:  stable Post Pull Site:  Level 0 Post Pull Instructions Given:  yes Post Pull Pulses Present: yes Dressing Applied:  tegaderm/gauze Bedrest begins @ 1130 Comments:

## 2016-11-18 NOTE — Anesthesia Postprocedure Evaluation (Signed)
Anesthesia Post Note  Patient: Adam Green  Procedure(s) Performed: Procedure(s) (LRB): Atrial Fibrillation Ablation (N/A)  Patient location during evaluation: PACU Anesthesia Type: General Level of consciousness: awake and alert Pain management: pain level controlled Vital Signs Assessment: post-procedure vital signs reviewed and stable Respiratory status: spontaneous breathing, nonlabored ventilation and respiratory function stable Cardiovascular status: blood pressure returned to baseline and stable Postop Assessment: no signs of nausea or vomiting Anesthetic complications: no    Last Vitals:  Vitals:   11/18/16 1120 11/18/16 1125  BP: 111/73 111/69  Pulse: 96 92  Resp: 17 16  Temp:      Last Pain:  Vitals:   11/18/16 1112  TempSrc:   PainSc: 8                  [name],W. EDMOND

## 2016-11-18 NOTE — Transfer of Care (Signed)
Immediate Anesthesia Transfer of Care Note  Patient: Adam Green  Procedure(s) Performed: Procedure(s): Atrial Fibrillation Ablation (N/A)  Patient Location: PACU  Anesthesia Type:General  Level of Consciousness: awake, alert , oriented and patient cooperative  Airway & Oxygen Therapy: Patient Spontanous Breathing and Patient connected to nasal cannula oxygen  Post-op Assessment: Report given to RN, Post -op Vital signs reviewed and stable, Patient moving all extremities and Patient moving all extremities X 4  Post vital signs: Reviewed and stable  Last Vitals:  Vitals:   11/18/16 0544 11/18/16 1050  BP: 105/76 121/82  Pulse: 64 92  Resp: 20 (!) 8  Temp: 36.4 C 36.4 C    Last Pain:  Vitals:   11/18/16 1050  TempSrc: Temporal         Complications: No apparent anesthesia complications

## 2016-11-18 NOTE — Progress Notes (Signed)
Doing well s/p afib ablation VSS Limited echo reveals no effusion  Groin looks OK  DC to home Routine groin care and management  Thompson Grayer MD, [facility] 11/18/2016 3:55 PM

## 2016-11-18 NOTE — H&P (View-Only) (Signed)
Electrophysiology Office Note   Date:  10/24/2016   ID:  Adam, Green [birth year], MRN [id number]  PCP:  Lynne Logan, MD  Primary Electrophysiologist: Dr Caryl Comes  Chief Complaint  Patient presents with  . Atrial Fibrillation     History of Present Illness: Adam Green is a 35 y.o. male who presents today for electrophysiology follow-up.   He was recently seen by Dr Caryl Comes.  He has had afib for about a year.  Episodes have increased in frequency and duration.  Currently, he has afib about once per week.  He was recently evaluated by Dr Caryl Comes and initiated on flecainide.  He is not tolerating this medicine due to restlessness at night and headaches.  He has significantly reduced ETOH.  Today, he denies symptoms of chest pain, shortness of breath, orthopnea, PND, lower extremity edema, claudication, dizziness, presyncope, syncope, bleeding, or neurologic sequela. The patient is tolerating medications without difficulties and is otherwise without complaint today.    Past Medical History:  Diagnosis Date  . A-fib [facility])    Past Surgical History:  Procedure Laterality Date  . FEMUR FRACTURE SURGERY Left   . FINGER SURGERY    . FOREIGN BODY REMOVAL Right 06/16/2016   Procedure: FOREIGN BODY REMOVAL;  Surgeon: Leanora Cover, MD;  Location: [place];  Service: Orthopedics;  Laterality: Right;  . ORIF ANKLE FRACTURE       Current Outpatient Prescriptions  Medication Sig Dispense Refill  . flecainide (TAMBOCOR) 50 MG tablet Take 1 tablet (50 mg total) by mouth 2 (two) times daily. 180 tablet 3  . ibuprofen (ADVIL,MOTRIN) 200 MG tablet Take 400 mg by mouth every 6 (six) hours as needed (pain).     . metoprolol (LOPRESSOR) 50 MG tablet Take 50 mg by mouth daily as needed (for increased blood pressure or palpitations).    . Multiple Vitamins-Minerals (MULTIVITAMIN WITH MINERALS) tablet Take 1 tablet by mouth daily.    . rivaroxaban (XARELTO) 20 MG TABS  tablet Take 1 tablet (20 mg total) by mouth daily with supper. 30 tablet 11   No current facility-administered medications for this visit.     Allergies:   Dexpak 10 day [dexamethasone]   Social History:  The patient  reports that he quit smoking about 10 years ago. His smoking use included Cigarettes. He has never used smokeless tobacco. He reports that he drinks about 1.8 oz of alcohol per week . He reports that he does not use drugs.   Family History:  The patient denies FH of early AF   ROS:  Please see the history of present illness.   All other systems are reviewed and negative.    PHYSICAL EXAM: VS:  BP 100/68   Pulse 60   Ht 5' 9.5" (1.765 m)   Wt 177 lb 6.4 oz (80.5 kg)   LMP  (Exact Date)   BMI 25.82 kg/m  , BMI Body mass index is 25.82 kg/m. GEN: Well nourished, well developed, in no acute distress  HEENT: normal  Neck: no JVD, carotid bruits, or masses Cardiac: RRR; no murmurs, rubs, or gallops,no edema  Respiratory:  clear to auscultation bilaterally, normal work of breathing GI: soft, nontender, nondistended, + BS MS: no deformity or atrophy  Skin: warm and dry  Neuro:  Strength and sensation are intact Psych: euthymic mood, full affect  EKG:  EKG is ordered today. The ekg ordered today shows sinus rhythm 60 bpm, PR 166 msec, QRS 96 msec, otherwise  normal ekg   Recent Labs: 09/02/2016: Magnesium 2.1; TSH 1.23 10/03/2016: BUN 11; Creatinine, Ser 0.81; Hemoglobin 14.7; Platelets 193; Potassium 4.1; Sodium 140    Lipid Panel  No results found for: CHOL, TRIG, HDL, CHOLHDL, VLDL, LDLCALC, LDLDIRECT   Wt Readings from Last 3 Encounters:  10/24/16 177 lb 6.4 oz (80.5 kg)  10/05/16 176 lb (79.8 kg)  10/03/16 170 lb (77.1 kg)      Other studies Reviewed: Additional studies/ records that were reviewed today include: Dr Aquilla Hacker notes, prior echo  Review of the above records today demonstrates: as above   ASSESSMENT AND PLAN:  1.  Paroxysmal atrial  fibrillation The patient has very symptomatic recurrent PAF.  He has failed medical therapy with flecainide due to intolerance.  Therapeutic strategies for afib including medicine and ablation were discussed in detail with the patient today. Risk, benefits, and alternatives to EP study and radiofrequency ablation for afib were also discussed in detail today. These risks include but are not limited to stroke, bleeding, vascular damage, tamponade, perforation, damage to the esophagus, lungs, and other structures, pulmonary vein stenosis, worsening renal function, and death. The patient understands these risk and wishes to proceed.  We will therefore proceed with catheter ablation once the patient has been adequately anticoagulated.  Xarelto 20mg  daily is initiated at this time.  Will obtain cardiac CT prior to ablation to exclude LAA thrombus.  Today, I have spent 40 minutes with the patient discussing atrial fibrillation .  More than 50% of the visit time today was spent on this issue.    Current medicines are reviewed at length with the patient today.   The patient does not have concerns regarding his medicines.  The following changes were made today:  none  Labs/ tests ordered today include:  Orders Placed This Encounter  Procedures  . CT CARDIAC MORPH/PULM VEIN W/CM&W/O CA SCORE  . Basic metabolic panel  . CBC with Differential  . EKG 12-Lead     Signed, Thompson Grayer, MD  10/24/2016 10:47 PM     [place] [address] [place] [address] [phone] (office) [phone] (fax)

## 2016-11-20 NOTE — Procedures (Signed)
   Patient Name: Adam Green, Was Date: 11/01/2016 Gender: Male D.O.B: 26-Jun-1981 Age (years): 34 Referring Provider: Virl Axe Height (inches): 44 Interpreting Physician: Fransico Him MD, ABSM Weight (lbs): 170 RPSGT: Laren Everts BMI: 25 MRN: [id number] Neck Size: 14.50  CLINICAL INFORMATION Sleep Study Type: NPSG  Indication for sleep study: Fatigue, Morning Headaches  Epworth Sleepiness Score: 8  SLEEP STUDY TECHNIQUE As per the AASM Manual for the Scoring of Sleep and Associated Events v2.3 (April 2016) with a hypopnea requiring 4% desaturations.  The channels recorded and monitored were frontal, central and occipital EEG, electrooculogram (EOG), submentalis EMG (chin), nasal and oral airflow, thoracic and abdominal wall motion, anterior tibialis EMG, snore microphone, electrocardiogram, and pulse oximetry.  MEDICATIONS Medications self-administered by patient taken the night of the study : N/A  SLEEP ARCHITECTURE The study was initiated at 10:03:10 PM and ended at 4:14:02 AM.  Sleep onset time was 22.9 minutes and the sleep efficiency was 77.0%. The total sleep time was 285.5 minutes.  Stage REM latency was 80.5 minutes.  The patient spent 5.78% of the night in stage N1 sleep, 79.33% in stage N2 sleep, 0.00% in stage N3 and 14.89% in REM.  Alpha intrusion was absent.  Supine sleep was 27.85%.  RESPIRATORY PARAMETERS The overall apnea/hypopnea index (AHI) was 0.0 per hour. There were 0 total apneas, including 0 obstructive, 0 central and 0 mixed apneas. There were 0 hypopneas and 0 RERAs.  The AHI during Stage REM sleep was 0.0 per hour.  AHI while supine was 0.0 per hour.  The mean oxygen saturation was 94.80%. The minimum SpO2 during sleep was 93.00%.  Soft snoring was noted during this study.  CARDIAC DATA The 2 lead EKG demonstrated sinus rhythm. The mean heart rate was 57.42 beats per minute. Other EKG findings include: PVCs.  LEG  MOVEMENT DATA The total PLMS were 14 with a resulting PLMS index of 2.94. Associated arousal with leg movement index was 1.1 .  IMPRESSIONS - No significant obstructive sleep apnea occurred during this study (AHI = 0.0/h). - No significant central sleep apnea occurred during this study (CAI = 0.0/h). - The patient had minimal or no oxygen desaturation during the study (Min O2 = 93.00%) - The patient snored with Soft snoring volume. - EKG findings include PVCs. - Clinically significant periodic limb movements did not occur during sleep. No significant associated arousals.  DIAGNOSIS - Normal study  RECOMMENDATIONS - Avoid alcohol, sedatives and other CNS depressants that may worsen sleep apnea and disrupt normal sleep architecture. - Sleep hygiene should be reviewed to assess factors that may improve sleep quality. - Weight management and regular exercise should be initiated or continued if appropriate  .[place], American Board of Sleep Medicine  ELECTRONICALLY SIGNED ON:  11/20/2016, 8:51 PM [place] PH: [phone]   FX: [phone] [place]

## 2016-11-21 ENCOUNTER — Telehealth: Payer: Self-pay | Admitting: Internal Medicine

## 2016-11-21 NOTE — Telephone Encounter (Signed)
LEFT 2 BOTTLES OF XARELTO 20 MG AT FRONT DESK, PT AWARE.

## 2016-11-21 NOTE — Progress Notes (Signed)
Spoke to the patient gave him his results he verbalized understanding

## 2016-11-21 NOTE — Telephone Encounter (Signed)
New message  Patient needs samples   xarelto 20mg  1 tab daily  Please follow up

## 2016-11-22 ENCOUNTER — Telehealth (HOSPITAL_COMMUNITY): Payer: Self-pay | Admitting: *Deleted

## 2016-11-22 ENCOUNTER — Telehealth: Payer: Self-pay | Admitting: Internal Medicine

## 2016-11-22 NOTE — Telephone Encounter (Signed)
Pt was walk in at [address] office for headaches since ablation. Asked by Janan Halter RN to call and check on patient. Talked with pt - he states headaches come and go but are pretty severe "head buried in the covers" when they are bad. He has tried tylenol only once and it did dull the pain. Pt states he had headaches a lot with flecainide but thought they would have stopped since flecainide was D/C'd at discharge. Pt is having no other neurological symptoms including no visual changes. Pt states HR/BP are stable. Discussed with Roderic Palau NP encouraged pt to try tylenol around the clock for next few days and hopefully will notice improvement. Instructed pt if headaches became so severe he should report to ER for further evaluation. Pt verbalized understanding.

## 2016-11-22 NOTE — Telephone Encounter (Signed)
Walk In pt Form-pt requested phone call having severe headaches since ablation on Friday. Gave to [employer]

## 2016-11-23 ENCOUNTER — Encounter (HOSPITAL_BASED_OUTPATIENT_CLINIC_OR_DEPARTMENT_OTHER): Payer: BLUE CROSS/BLUE SHIELD

## 2016-11-27 ENCOUNTER — Encounter (HOSPITAL_BASED_OUTPATIENT_CLINIC_OR_DEPARTMENT_OTHER): Payer: BLUE CROSS/BLUE SHIELD

## 2016-12-04 ENCOUNTER — Telehealth: Payer: Self-pay | Admitting: Physician Assistant

## 2016-12-04 NOTE — Telephone Encounter (Signed)
Patient called answering service to ask questions about atrial fib. S/p Ablation on 11/18/16. He was informed he may have episodic return of atrial fib post-procedure. Lately he has been feeling intermittent palpitations lasting <39min as well as some symptoms of morning fatigue which is similar to what he felt when the atrial fib was diagnosed. BP tends to run borderline low. Reviewed with Dr. Curt Bears who initially recommended restarting flecainide and metoprolol for now. The patient does not wish to resume flecainide as it did not agree with him. We discussed that he could resume low dose beta blocker, first starting with 1/2 tablet (25mg ) QPM to see if this helps, and if he has breakthrough sx, could increase to 1/2 tablet BID as long as BP remained stable and he was not feeling worse. ER precautions reviewed. Will send to Dr. Rayann Heman for review - Dr. Curt Bears recommends early f/u with either Dr. Rayann Heman or Atrial fib clinic this week. Have also sent to EP scheduler. Hollan Philipp PA-C

## 2016-12-06 ENCOUNTER — Telehealth: Payer: Self-pay | Admitting: Internal Medicine

## 2016-12-06 NOTE — Telephone Encounter (Signed)
Spoke with patient and made him aware that samples are intended for new starts and are not meant to maintain a patient on the medication. He stated that he was instructed at his office visit to call the office and request samples as he may not be on the medication long term, but he stated that he can pick this up at the pharmacy if necessary. He has [employer] and requested that the rx be sent to [facility] in [place]. He actually already has refills there and I made him aware of this and he stated that he will have it filled.

## 2016-12-06 NOTE — Telephone Encounter (Signed)
Called to check on patient. States the metoprolol 1/2 tablet at bedtime has helped for the last 2 days. He does not feel he needs to come in before his scheduled appt but felt like he "should be back to normal" and this is what prompted him to call. Reviewed again that after ablation intermittent afib is completely expected. Pt will call back if has further problems.

## 2016-12-06 NOTE — Telephone Encounter (Signed)
Patient calling the office for samples of medication:   1.  What medication and dosage are you requesting samples for? xarelto  2.  Are you currently out of this medication? Today last day

## 2016-12-07 ENCOUNTER — Other Ambulatory Visit (HOSPITAL_COMMUNITY): Payer: Self-pay | Admitting: *Deleted

## 2016-12-19 ENCOUNTER — Ambulatory Visit (HOSPITAL_COMMUNITY): Payer: BLUE CROSS/BLUE SHIELD | Admitting: Nurse Practitioner

## 2016-12-21 ENCOUNTER — Ambulatory Visit (HOSPITAL_COMMUNITY): Payer: BLUE CROSS/BLUE SHIELD | Admitting: Nurse Practitioner

## 2016-12-27 ENCOUNTER — Encounter (HOSPITAL_COMMUNITY): Payer: Self-pay | Admitting: Nurse Practitioner

## 2016-12-27 ENCOUNTER — Ambulatory Visit (HOSPITAL_COMMUNITY)
Admission: RE | Admit: 2016-12-27 | Discharge: 2016-12-27 | Disposition: A | Discharge status: Home / Self Care | Payer: BLUE CROSS/BLUE SHIELD | Source: Ambulatory Visit | Attending: Nurse Practitioner | Admitting: Nurse Practitioner

## 2016-12-27 VITALS — BP 110/76 | HR 90 | Ht 69.0 in | Wt 179.2 lb

## 2016-12-27 DIAGNOSIS — I4891 Unspecified atrial fibrillation: Secondary | ICD-10-CM | POA: Diagnosis not present

## 2016-12-27 DIAGNOSIS — Z7901 Long term (current) use of anticoagulants: Secondary | ICD-10-CM | POA: Diagnosis not present

## 2016-12-27 DIAGNOSIS — I480 Paroxysmal atrial fibrillation: Secondary | ICD-10-CM

## 2016-12-27 NOTE — Progress Notes (Signed)
       PCP: Lynne Logan, MD  Adam Green is a 36 y.o. male who presents today for routine electrophysiology followup.  Since his recent ablation, the patient reports doing very well.  He denies procedure related complications.  He has occasional ERAF.  He feels "washed out" and fatigue at times.  Today, he denies symptoms of chest pain, shortness of breath,  lower extremity edema, dizziness, presyncope, or syncope.  The patient is otherwise without complaint today.   Past Medical History:  Diagnosis Date  . A-fib [facility])    Past Surgical History:  Procedure Laterality Date  . ELECTROPHYSIOLOGIC STUDY N/A 11/18/2016   afib ablation performed by Dr Rayann Heman  . FEMUR FRACTURE SURGERY Left   . FINGER SURGERY    . FOREIGN BODY REMOVAL Right 06/16/2016   Procedure: FOREIGN BODY REMOVAL;  Surgeon: Leanora Cover, MD;  Location: [place];  Service: Orthopedics;  Laterality: Right;  . ORIF ANKLE FRACTURE      ROS- all systems are reviewed and negatives except as per HPI above  Current Outpatient Prescriptions  Medication Sig Dispense Refill  . acetaminophen (TYLENOL) 500 MG tablet Take 1,000 mg by mouth every 6 (six) hours as needed for headache.    . Multiple Vitamins-Minerals (MULTIVITAMIN WITH MINERALS) tablet Take 1 tablet by mouth daily.    . rivaroxaban (XARELTO) 20 MG TABS tablet Take 1 tablet (20 mg total) by mouth daily with supper. 30 tablet 11   No current facility-administered medications for this encounter.     Physical Exam: Vitals:   12/27/16 1537  BP: 110/76  Pulse: 90  Weight: 179 lb 3.2 oz (81.3 kg)  Height: 5\' 9"  (1.753 m)    GEN- The patient is well appearing, alert and oriented x 3 today.   Head- normocephalic, atraumatic Eyes-  Sclera clear, conjunctiva pink Ears- hearing intact Oropharynx- clear Lungs- Clear to ausculation bilaterally, normal work of breathing Heart- Regular rate and rhythm, no murmurs, rubs or gallops, PMI not  laterally displaced GI- soft, NT, ND, + BS Extremities- no clubbing, cyanosis, or edema  ekg today reveals sinus rhythm  Assessment and Plan:  1. afib Doing well s/p ablation I suspect that he has occasional ERAF which will hopefully improve.  We discussed [employer] as an option today.  I do not recommend event monitor or holter at this time. Continue xarelto  Follow-up with me as scheduled in march Hopefully we can stop xarelto at that time.  Thompson Grayer MD, [facility] 12/27/2016 4:16 PM

## 2016-12-30 ENCOUNTER — Telehealth: Payer: Self-pay | Admitting: Internal Medicine

## 2016-12-30 MED ORDER — APIXABAN 5 MG PO TABS: 5.0000 mg | ORAL_TABLET | Freq: Two times a day (BID) | ORAL | 4 refills | Status: DC

## 2016-12-30 NOTE — Telephone Encounter (Signed)
Returned call to patient's wife and she says for the last few weeks he says he says he has had intense muscle weakness and muscle pain.  Per the wife she says the Xarelto commercial says if you experience any of these symptoms call your doctor immediately.  She says when he was in the office it had not happen for the last 5 days.    Discussed with Dr Rayann Heman and can switch to Eliquis 5 mg twice daily to see if his symptoms get better.  New Rx sent in.  Wife will call back if he does not get better.  She says he reacts strange to most medications.

## 2016-12-30 NOTE — Telephone Encounter (Signed)
Mrs. Adam Green is calling on behalf of her husband (patient). She states that patient has been taking xarelto and now has "unbearable" pain in both legs.Please call, thanks.

## 2016-12-30 NOTE — Telephone Encounter (Signed)
Follow Up:    Pt and his wife got on the phone and said they wanted to talk to the nurse if not Dr Rayann Heman.They said Dr Rayann Heman prescribed the medicine and he should be taking care of the problem and that this is a side effect of the medicine.

## 2017-02-03 IMAGING — DX DG CHEST 2V
2 series · 2 of 2 positions shown · non-contrast
Comparison: No prior.

CLINICAL DATA: Chest pain.  Weakness.

EXAM:
CHEST  2 VIEW

[chest pa]
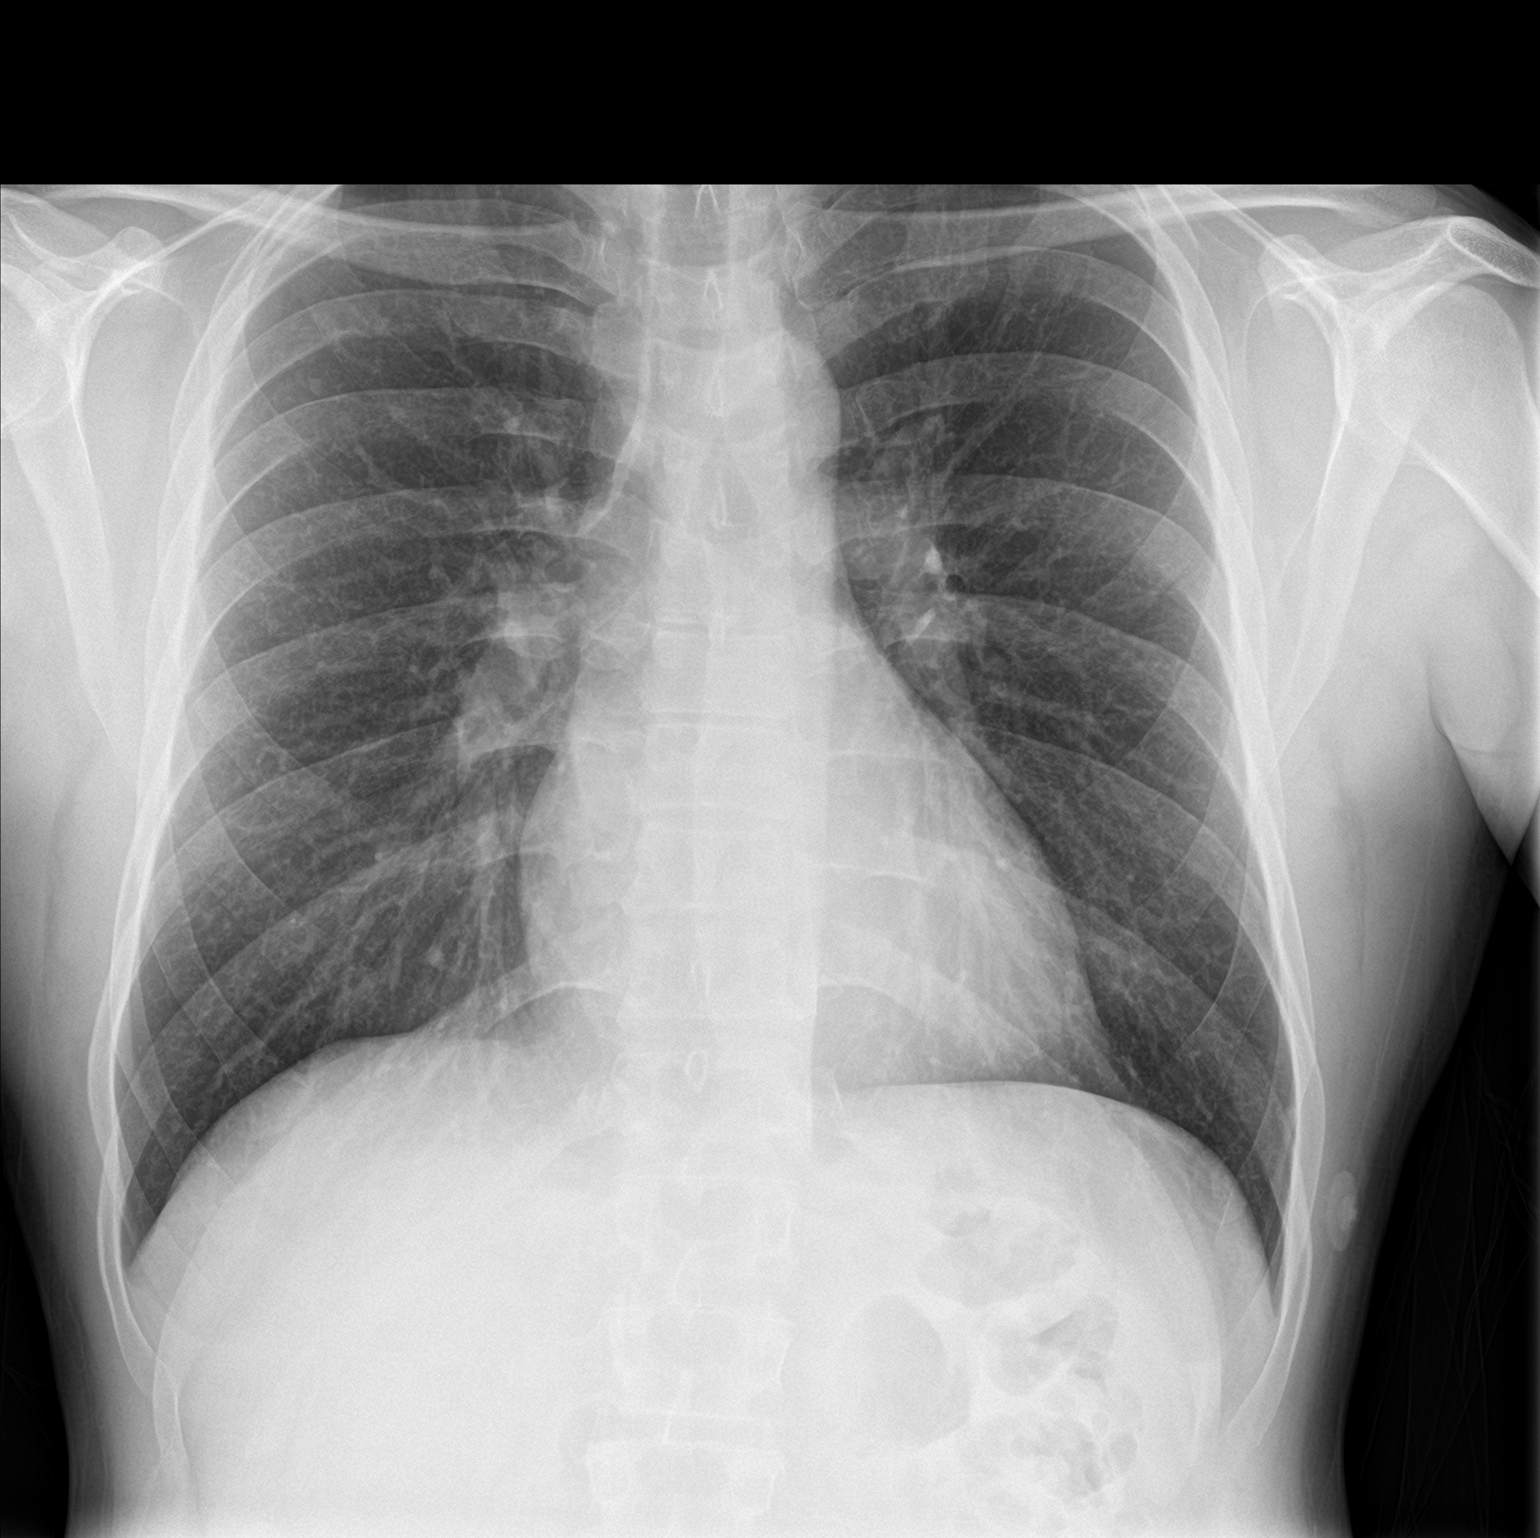

[chest lat]
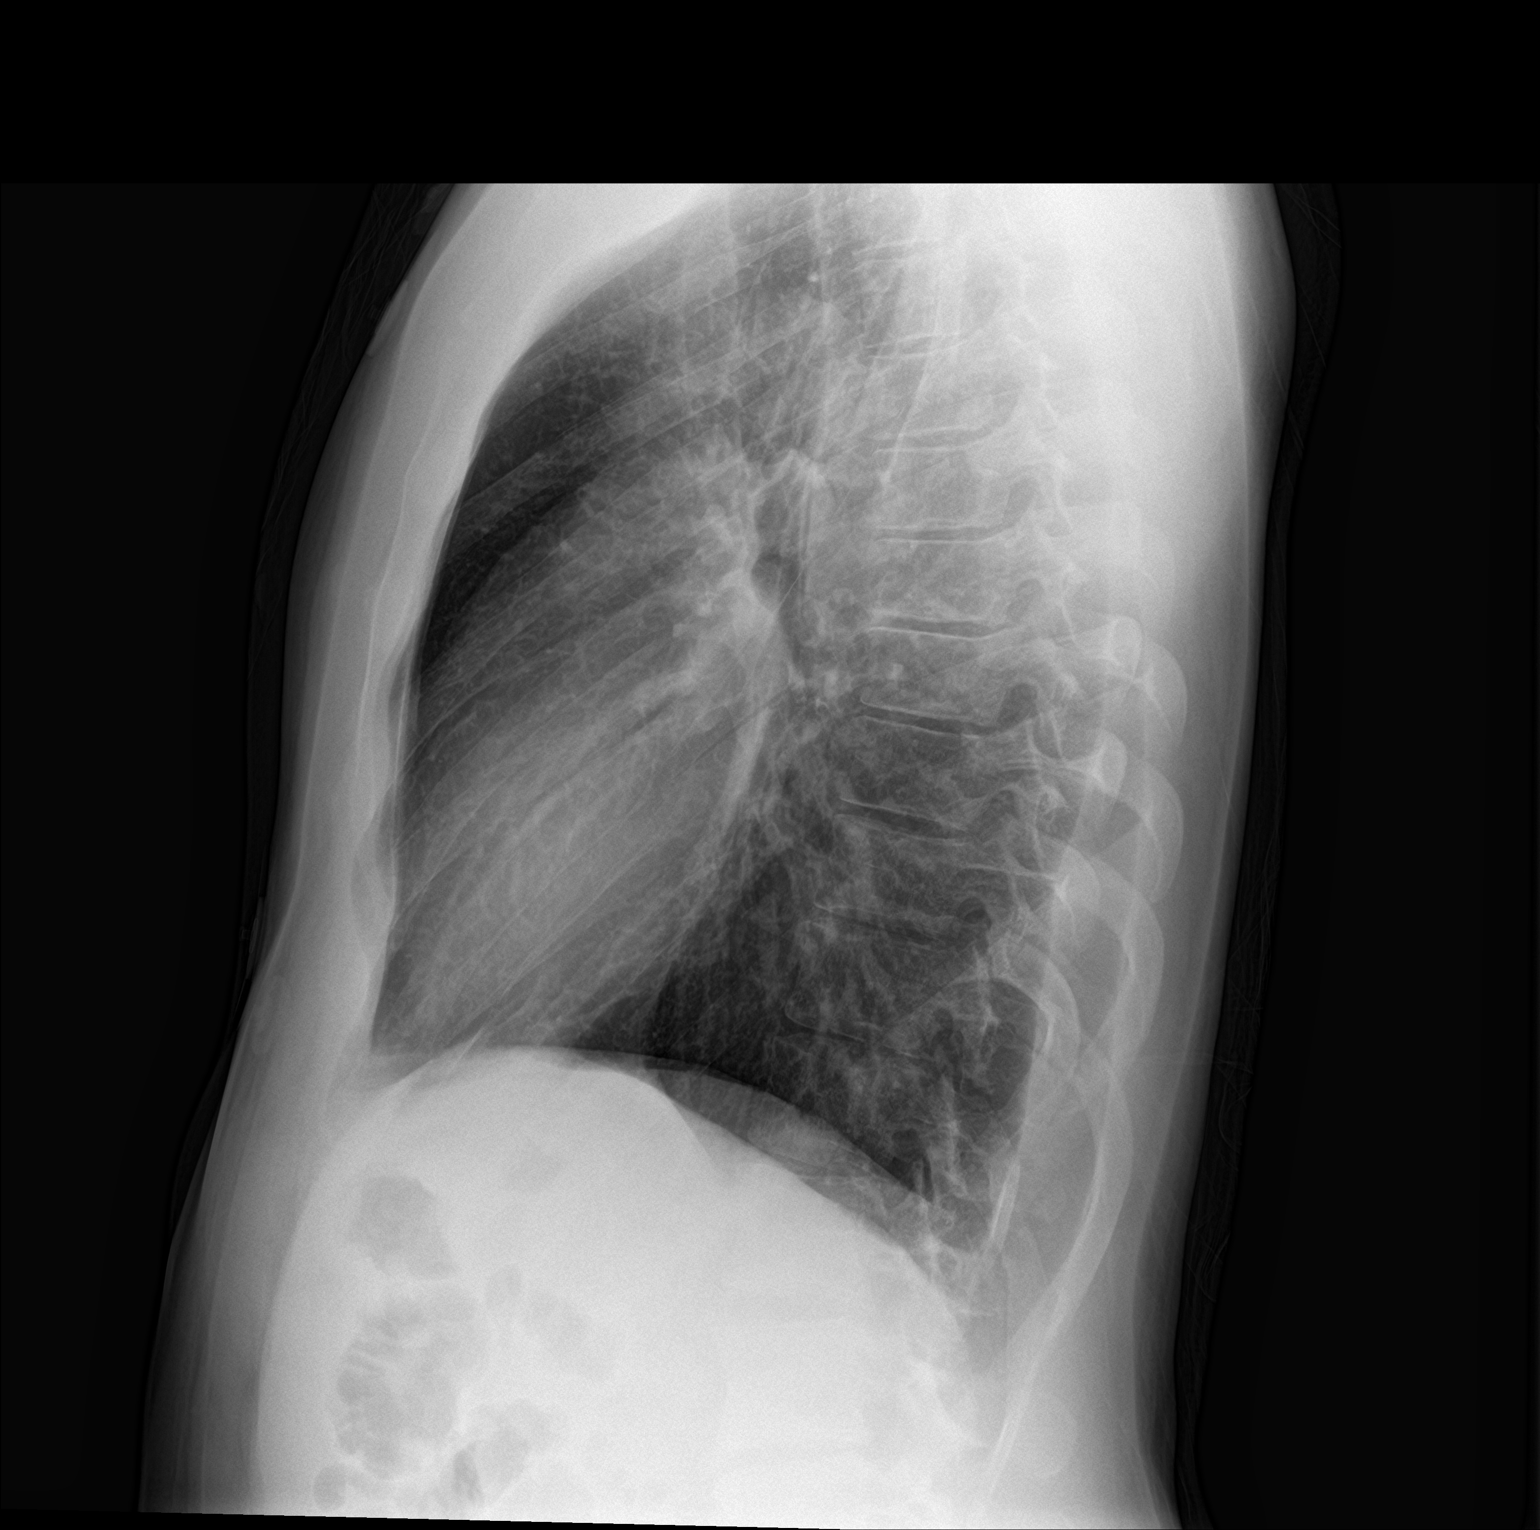

[2 of 2 positions shown; findings below may reference images not displayed]

FINDINGS: Mediastinum hilar structures normal. Lungs are clear. Heart size
normal. No pleural effusion or pneumothorax. No acute bony
abnormality .
IMPRESSION: No acute cardiopulmonary disease.

## 2017-02-08 ENCOUNTER — Encounter: Payer: Self-pay | Admitting: Internal Medicine

## 2017-02-20 ENCOUNTER — Encounter: Payer: Self-pay | Admitting: Internal Medicine

## 2017-02-20 ENCOUNTER — Ambulatory Visit (INDEPENDENT_AMBULATORY_CARE_PROVIDER_SITE_OTHER): Payer: BLUE CROSS/BLUE SHIELD | Admitting: Internal Medicine

## 2017-02-20 VITALS — BP 100/68 | HR 80 | Ht 69.5 in | Wt 168.0 lb

## 2017-02-20 DIAGNOSIS — I480 Paroxysmal atrial fibrillation: Secondary | ICD-10-CM

## 2017-02-20 NOTE — Progress Notes (Signed)
     PCP: Lynne Logan, MD  Adam Green is a 36 y.o. male who presents today for routine electrophysiology followup.  Since his recent ablation, the patient reports doing very well.  He denies procedure related complications.   He feels "washed out" and fatigue at times but is not certain if he is in sinus or afib.  He also has occasional palpitations but lasting only 10-15 seconds.  Today, he denies symptoms of chest pain, shortness of breath,  lower extremity edema, dizziness, presyncope, or syncope.  The patient is otherwise without complaint today.   Past Medical History:  Diagnosis Date  . A-fib [facility])    Past Surgical History:  Procedure Laterality Date  . ELECTROPHYSIOLOGIC STUDY N/A 11/18/2016   afib ablation performed by Dr Rayann Heman  . FEMUR FRACTURE SURGERY Left   . FINGER SURGERY    . FOREIGN BODY REMOVAL Right 06/16/2016   Procedure: FOREIGN BODY REMOVAL;  Surgeon: Leanora Cover, MD;  Location: [place];  Service: Orthopedics;  Laterality: Right;  . ORIF ANKLE FRACTURE      ROS- all systems are reviewed and negatives except as per HPI above  Current Outpatient Prescriptions  Medication Sig Dispense Refill  . acetaminophen (TYLENOL) 500 MG tablet Take 1,000 mg by mouth every 6 (six) hours as needed for headache.    Marland Kitchen apixaban (ELIQUIS) 5 MG TABS tablet Take 1 tablet (5 mg total) by mouth 2 (two) times daily. 60 tablet 4  . Multiple Vitamins-Minerals (MULTIVITAMIN WITH MINERALS) tablet Take 1 tablet by mouth daily.     No current facility-administered medications for this visit.     Physical Exam: Vitals:   02/20/17 1534  BP: 100/68  Pulse: 80  SpO2: 96%  Weight: 168 lb (76.2 kg)  Height: 5' 9.5" (1.765 m)    GEN- The patient is well appearing, alert and oriented x 3 today.   Head- normocephalic, atraumatic Eyes-  Sclera clear, conjunctiva pink Ears- hearing intact Oropharynx- clear Lungs- Clear to ausculation bilaterally, normal work of  breathing Heart- Regular rate and rhythm, no murmurs, rubs or gallops, PMI not laterally displaced GI- soft, NT, ND, + BS Extremities- no clubbing, cyanosis, or edema Neuro- strength/ sensation intact  ekg today reveals sinus rhythm  Assessment and Plan:  1. afib/ palpitations Doing well s/p ablation Stop anticoagulation today He is concerned about possibility of additional afib.  His symptoms are somewhat atypical.  Today, I have spent 25 minutes with the patient discussing options including monitoring with an implantable loop recorder for afib management post ablation and to better evaluate his palpitations .  More than 50% of the visit time today was spent on this issue.  At this time, he is not sure if he wants to proceed.  He would therefore like to think about this further and will contact our office if he wishes to proceed.  Return to see me in 3 months  Thompson Grayer MD, [facility] 02/20/2017 4:03 PM

## 2017-02-20 NOTE — Patient Instructions (Addendum)
Medication Instructions: - Your physician has recommended you make the following change in your medication: 1) Stop eliquis  Labwork: - none ordered  Procedures/Testing: - Please call Dr. Jackalyn Lombard nurse, Claiborne Billings, if you decide to proceed with an Implantable Loop Recorder [facility] monitor).  Follow-Up: - Your physician recommends that you schedule a follow-up appointment in: 3 months with Dr. Rayann Heman.    Any Additional Special Instructions Will Be Listed Below (If Applicable).     If you need a refill on your cardiac medications before your next appointment, please call your pharmacy.

## 2017-02-24 ENCOUNTER — Ambulatory Visit: Payer: BLUE CROSS/BLUE SHIELD | Admitting: Internal Medicine

## 2017-05-29 ENCOUNTER — Telehealth: Payer: Self-pay | Admitting: Internal Medicine

## 2017-05-29 ENCOUNTER — Ambulatory Visit (INDEPENDENT_AMBULATORY_CARE_PROVIDER_SITE_OTHER): Payer: 59 | Admitting: Internal Medicine

## 2017-05-29 ENCOUNTER — Encounter: Payer: Self-pay | Admitting: Internal Medicine

## 2017-05-29 VITALS — BP 110/70 | HR 78 | Ht 69.5 in | Wt 174.6 lb

## 2017-05-29 DIAGNOSIS — I480 Paroxysmal atrial fibrillation: Secondary | ICD-10-CM | POA: Diagnosis not present

## 2017-05-29 DIAGNOSIS — R911 Solitary pulmonary nodule: Secondary | ICD-10-CM | POA: Diagnosis not present

## 2017-05-29 NOTE — Telephone Encounter (Signed)
New message     Pt just left appt , he has a question about a nodule on his lung, and that he need a referral and a CT scan, he needs that ordered now, since dr Allred was the one who found it during his surgery.

## 2017-05-29 NOTE — Patient Instructions (Addendum)

## 2017-05-29 NOTE — Progress Notes (Signed)
   PCP: Donald Prose, MD  Adam Green is a 36 y.o. male who presents today for routine electrophysiology followup.  Since last being seen in our clinic, the patient reports doing very well.  His grandmother is in hospice for cancer in [place].  He finds that this is very stressful for him.  He has rare nonsustained palpitations.  + occasional postural dizziness. Energy is improved.  Today, he denies symptoms of chest pain, shortness of breath,  lower extremity edema,  presyncope, or syncope.  The patient is otherwise without complaint today.   Past Medical History:  Diagnosis Date  . A-fib [facility])    Past Surgical History:  Procedure Laterality Date  . ELECTROPHYSIOLOGIC STUDY N/A 11/18/2016   afib ablation performed by Dr Rayann Heman  . FEMUR FRACTURE SURGERY Left   . FINGER SURGERY    . FOREIGN BODY REMOVAL Right 06/16/2016   Procedure: FOREIGN BODY REMOVAL;  Surgeon: Leanora Cover, MD;  Location: [place];  Service: Orthopedics;  Laterality: Right;  . ORIF ANKLE FRACTURE      ROS- all systems are reviewed and negatives except as per HPI above  Current Outpatient Prescriptions  Medication Sig Dispense Refill  . acetaminophen (TYLENOL) 500 MG tablet Take 1,000 mg by mouth every 6 (six) hours as needed for headache.    . Multiple Vitamins-Minerals (MULTIVITAMIN WITH MINERALS) tablet Take 1 tablet by mouth daily.     No current facility-administered medications for this visit.     Physical Exam: Vitals:   05/29/17 1609  BP: 110/70  Pulse: 78  Weight: 174 lb 9.6 oz (79.2 kg)  Height: 5' 9.5" (1.765 m)    GEN- The patient is well appearing, alert and oriented x 3 today.   Head- normocephalic, atraumatic Eyes-  Sclera clear, conjunctiva pink Ears- hearing intact Oropharynx- clear Lungs- Clear to ausculation bilaterally, normal work of breathing Heart- Regular rate and rhythm, no murmurs, rubs or gallops, PMI not laterally displaced GI- soft, NT, ND, +  BS Extremities- no clubbing, cyanosis, or edema  EKG tracing ordered today is personally reviewed and shows sinus rhythm  Assessment and Plan:  1. Paroxysmal atrial fibrillation Well controlled post ablation off AAD therapy chad2vasc score is 0.   No further workup planned We did discuss ILR.  Given reduction in symptoms, he is not interested at this time.  2. Pulmonary nodule Prior CT is reviewed and reveals LLL lung nodule.  Recommended repeat CT was advised. Will order repeat CT at this time given prior smoking history.   Return in 6 months  Thompson Grayer MD, [facility] 05/29/2017 4:24 PM

## 2017-05-30 NOTE — Telephone Encounter (Addendum)
Patient informed that he would be scheduled for CT chest and verbalized understanding of plan. Order placed and sent to [facility] and schedulers.

## 2017-06-08 ENCOUNTER — Inpatient Hospital Stay: Admission: RE | Admit: 2017-06-08 | Payer: 59 | Source: Ambulatory Visit

## 2017-06-09 ENCOUNTER — Other Ambulatory Visit: Payer: 59

## 2017-06-13 ENCOUNTER — Ambulatory Visit (INDEPENDENT_AMBULATORY_CARE_PROVIDER_SITE_OTHER)
Admission: RE | Admit: 2017-06-13 | Discharge: 2017-06-13 | Disposition: A | Discharge status: Home / Self Care | Payer: 59 | Source: Ambulatory Visit | Attending: Internal Medicine | Admitting: Internal Medicine

## 2017-06-13 DIAGNOSIS — R911 Solitary pulmonary nodule: Secondary | ICD-10-CM | POA: Diagnosis not present

## 2017-06-20 ENCOUNTER — Telehealth: Payer: Self-pay | Admitting: Internal Medicine

## 2017-06-20 NOTE — Telephone Encounter (Signed)
New message      Pt calling for CT results from last week, he was told he would have them by the end of the week.

## 2017-06-21 NOTE — Telephone Encounter (Signed)
Late entry:  Spoke with patient and his wife last night.  Gave them the results of CT.  His wife is questioning the difference as this one says:Trace aortic atherosclerosis.  I let her know I would discuss with Dr Rayann Heman next week and call them back.

## 2017-06-26 NOTE — Telephone Encounter (Addendum)
Discussed with Dr Rayann Heman.  CT ok.  Patient aware.  When speaking with him in regards to CT he mentioned he has felt his heart racing.  Episodes were brief.  Dr Rayann Heman had suggested the [place] monitor for phone.  He still has a flip phone and can not do this application as his phone is not a smart phone.  The next route will be LINQ implant.  He will continue to see how things go and call back if he wishes to proceed with LINQ implant.

## 2017-10-11 ENCOUNTER — Telehealth: Payer: Self-pay | Admitting: Internal Medicine

## 2017-10-11 NOTE — Telephone Encounter (Signed)
New message     Patient calling wants to know the cost of implant or is their a dx code he can give to his insurance to check on cost as well.

## 2017-10-11 NOTE — Telephone Encounter (Signed)
Spoke with patient regarding dx code for [facility] recorder.. Patient verbalized understanding

## 2017-11-22 ENCOUNTER — Ambulatory Visit: Payer: 59 | Admitting: Internal Medicine

## 2017-11-22 ENCOUNTER — Encounter: Payer: Self-pay | Admitting: Internal Medicine

## 2017-11-22 VITALS — BP 108/70 | HR 80 | Ht 69.5 in | Wt 177.4 lb

## 2017-11-22 DIAGNOSIS — I480 Paroxysmal atrial fibrillation: Secondary | ICD-10-CM | POA: Diagnosis not present

## 2017-11-22 DIAGNOSIS — R911 Solitary pulmonary nodule: Secondary | ICD-10-CM | POA: Diagnosis not present

## 2017-11-22 NOTE — Patient Instructions (Signed)
Medication Instructions:  Your physician recommends that you continue on your current medications as directed. Please refer to the Current Medication list given to you today.   Labwork: None ordered   Testing/Procedures: Non-Cardiac CT scanning, (CAT scanning), is a noninvasive, special x-ray that produces cross-sectional images of the body using x-rays and a computer. CT scans help physicians diagnose and treat medical conditions. For some CT exams, a contrast material is used to enhance visibility in the area of the body being studied. CT scans provide greater clarity and reveal more details than regular x-ray exams.    Follow-Up: Your physician recommends that you schedule a follow-up appointment in: July with Dr Rayann Heman    Any Other Special Instructions Will Be Listed Below (If Applicable).     If you need a refill on your cardiac medications before your next appointment, please call your pharmacy.

## 2017-11-22 NOTE — Progress Notes (Signed)
   PCP: Donald Prose, MD   Primary EP: Dr Suezanne Jacquet AIJALON KIRTZ is a 36 y.o. male who presents today for routine electrophysiology followup.  Since last being seen in our clinic, the patient reports doing very well.  He has rare palpitations, (which sound more like PACs, PVCs) when stressed.  No sustained symptoms.  Today, he denies symptoms of chest pain, shortness of breath,  lower extremity edema, dizziness, presyncope, or syncope.  The patient is otherwise without complaint today.   Past Medical History:  Diagnosis Date  . A-fib [facility])    Past Surgical History:  Procedure Laterality Date  . ELECTROPHYSIOLOGIC STUDY N/A 11/18/2016   afib ablation performed by Dr Rayann Heman  . FEMUR FRACTURE SURGERY Left   . FINGER SURGERY    . FOREIGN BODY REMOVAL Right 06/16/2016   Procedure: FOREIGN BODY REMOVAL;  Surgeon: Leanora Cover, MD;  Location: [place];  Service: Orthopedics;  Laterality: Right;  . ORIF ANKLE FRACTURE      ROS- all systems are reviewed and negatives except as per HPI above  Current Outpatient Medications  Medication Sig Dispense Refill  . acetaminophen (TYLENOL) 500 MG tablet Take 1,000 mg by mouth every 6 (six) hours as needed for headache.    . Multiple Vitamins-Minerals (MULTIVITAMIN WITH MINERALS) tablet Take 1 tablet by mouth daily.     No current facility-administered medications for this visit.     Physical Exam: Vitals:   11/22/17 1532  BP: 108/70  Pulse: 80  SpO2: 96%  Weight: 177 lb 6.4 oz (80.5 kg)  Height: 5' 9.5" (1.765 m)    GEN- The patient is well appearing, alert and oriented x 3 today.   Head- normocephalic, atraumatic Eyes-  Sclera clear, conjunctiva pink Ears- hearing intact Oropharynx- clear Lungs- Clear to ausculation bilaterally, normal work of breathing Heart- Regular rate and rhythm, no murmurs, rubs or gallops, PMI not laterally displaced GI- soft, NT, ND, + BS Extremities- no clubbing, cyanosis, or edema  EKG  tracing ordered today is personally reviewed and shows sinus rhythm 80 bpm, otherwise normal ekg  Assessment and Plan:  1. Paroxysmal atrial fibrillation Doing well off AAD therapy chads2vasc score is 0  2. Pulmonary nodule CT result reveals stable nodules Repeat scan 7/19  Return to see me in 6 months  Thompson Grayer MD, [facility] 11/22/2017 3:50 PM

## 2018-06-11 ENCOUNTER — Other Ambulatory Visit: Payer: 59

## 2018-08-08 ENCOUNTER — Ambulatory Visit: Payer: Managed Care, Other (non HMO) | Admitting: Internal Medicine

## 2018-08-08 ENCOUNTER — Encounter: Payer: Self-pay | Admitting: Internal Medicine

## 2018-08-08 VITALS — BP 114/76 | HR 80 | Ht 69.5 in | Wt 175.0 lb

## 2018-08-08 DIAGNOSIS — I480 Paroxysmal atrial fibrillation: Secondary | ICD-10-CM | POA: Diagnosis not present

## 2018-08-08 DIAGNOSIS — R911 Solitary pulmonary nodule: Secondary | ICD-10-CM | POA: Diagnosis not present

## 2018-08-08 NOTE — Patient Instructions (Signed)
Medication Instructions:  Your physician recommends that you continue on your current medications as directed. Please refer to the Current Medication list given to you today.  Labwork: You will have labs drawn today: BMP and Mg  Testing/Procedures: None ordered.  Follow-Up: Your physician recommends that you schedule a follow-up appointment in: 6 months with Chanetta Marshall  Any Other Special Instructions Will Be Listed Below (If Applicable).     If you need a refill on your cardiac medications before your next appointment, please call your pharmacy.

## 2018-08-08 NOTE — Progress Notes (Signed)
   PCP: Donald Prose, MD   Primary EP: Dr Suezanne Jacquet Adam Green is a 37 y.o. male who presents today for routine electrophysiology followup.  Since last being seen in our clinic, the patient reports doing very well.  He has occasional palpitations.  These occur when he is "stressed, worried, or anxious".  Otherwise, he is doing well.  Today, he denies symptoms of palpitations, chest pain, shortness of breath,  lower extremity edema, dizziness, presyncope, or syncope.  The patient is otherwise without complaint today.   Past Medical History:  Diagnosis Date  . A-fib [facility])    Past Surgical History:  Procedure Laterality Date  . ELECTROPHYSIOLOGIC STUDY N/A 11/18/2016   afib ablation performed by Dr Rayann Heman  . FEMUR FRACTURE SURGERY Left   . FINGER SURGERY    . FOREIGN BODY REMOVAL Right 06/16/2016   Procedure: FOREIGN BODY REMOVAL;  Surgeon: Leanora Cover, MD;  Location: [place];  Service: Orthopedics;  Laterality: Right;  . ORIF ANKLE FRACTURE      ROS- all systems are reviewed and negatives except as per HPI above  Current Outpatient Medications  Medication Sig Dispense Refill  . acetaminophen (TYLENOL) 500 MG tablet Take 1,000 mg by mouth every 6 (six) hours as needed for headache.    . Multiple Vitamins-Minerals (MULTIVITAMIN WITH MINERALS) tablet Take 1 tablet by mouth daily.     No current facility-administered medications for this visit.     Physical Exam: Vitals:   08/08/18 1604  BP: 114/76  Pulse: 80  SpO2: 97%  Weight: 175 lb (79.4 kg)  Height: 5' 9.5" (1.765 m)    GEN- The patient is well appearing, alert and oriented x 3 today.   Head- normocephalic, atraumatic Eyes-  Sclera clear, conjunctiva pink Ears- hearing intact Oropharynx- clear Lungs- Clear to ausculation bilaterally, normal work of breathing Heart- Regular rate and rhythm, no murmurs, rubs or gallops, PMI not laterally displaced GI- soft, NT, ND, + BS Extremities- no clubbing,  cyanosis, or edema Psych- anxious appearing today  Wt Readings from Last 3 Encounters:  08/08/18 175 lb (79.4 kg)  11/22/17 177 lb 6.4 oz (80.5 kg)  05/29/17 174 lb 9.6 oz (79.2 kg)    EKG tracing ordered today is personally reviewed and shows sinus rhythm 80 bpm, PR 158 msec, QRS 90 msec, Qtc 405 msec  Assessment and Plan:  1. Paroxysmal atrial fibrillation Doing well post ablation off AAD therapy He has occasional palpitations which sound more like anxiety to me.  I have advised implantable loop recorder for afib management post ablation.  He is not ready to proceed with this. His wife is worried about his "electrolytes".  I will check bmet, mg today. chads2vasc score is 0  2. Pulmonary nodules CT previously stable follow-up study has been ordered for december  Return to see EP NP in 6 months I will see in a year  Thompson Grayer MD, [facility] 08/08/2018 4:38 PM

## 2018-08-09 ENCOUNTER — Encounter: Payer: Self-pay | Admitting: *Deleted

## 2018-08-09 LAB — BASIC METABOLIC PANEL
BUN/Creatinine Ratio: 14 (ref 9–20)
BUN: 15 mg/dL (ref 6–20)
CALCIUM: 9.5 mg/dL (ref 8.7–10.2)
CO2: 24 mmol/L (ref 20–29)
Chloride: 104 mmol/L (ref 96–106)
Creatinine, Ser: 1.04 mg/dL (ref 0.76–1.27)
GFR, EST AFRICAN AMERICAN: 107 mL/min/{1.73_m2} (ref >59)
GFR, EST NON AFRICAN AMERICAN: 93 mL/min/{1.73_m2} (ref >59)
Glucose: 90 mg/dL (ref 65–99)
POTASSIUM: 4.3 mmol/L (ref 3.5–5.2)
Sodium: 142 mmol/L (ref 134–144)

## 2018-08-09 LAB — MAGNESIUM: Magnesium: 2 mg/dL (ref 1.6–2.3)

## 2018-08-13 ENCOUNTER — Telehealth: Payer: Self-pay | Admitting: Internal Medicine

## 2018-08-13 NOTE — Telephone Encounter (Signed)
New Message:      Patient is calling for lab results from 9/4

## 2018-08-13 NOTE — Telephone Encounter (Signed)
Released via MyChart.

## 2018-09-25 ENCOUNTER — Telehealth: Payer: Self-pay | Admitting: Internal Medicine

## 2018-09-25 NOTE — Telephone Encounter (Signed)
Patient would like letter emailed to him because he will be going to [facility] to have physical done. Patient's email [email or web address]. Please call patient with any questions

## 2018-09-25 NOTE — Telephone Encounter (Signed)
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 Advised him that I will forward to Dr. Lucienne Capers and if he is cleared we will email it to him.

## 2018-09-25 NOTE — Telephone Encounter (Signed)
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 Fax [phone] Email: [email or web address]

## 2018-09-26 NOTE — Telephone Encounter (Signed)
Call back received from Pt.  Pt requests letter be faxed to [phone].  Letter faxed.

## 2018-09-26 NOTE — Telephone Encounter (Signed)
Left message for Pt notifying that no personal emails can be sent from our office.  Advised his letter is available via [place].  Or letter can be picked up at front desk.  Or letter can be mailed.  Or letter can be faxed.  Requested call back to notify nurse how letter should be sent.

## 2018-11-16 ENCOUNTER — Ambulatory Visit (INDEPENDENT_AMBULATORY_CARE_PROVIDER_SITE_OTHER)
Admission: RE | Admit: 2018-11-16 | Discharge: 2018-11-16 | Disposition: A | Discharge status: Home / Self Care | Payer: Managed Care, Other (non HMO) | Source: Ambulatory Visit | Attending: Internal Medicine | Admitting: Internal Medicine

## 2018-11-16 DIAGNOSIS — D229 Melanocytic nevi, unspecified: Secondary | ICD-10-CM

## 2018-11-16 DIAGNOSIS — R911 Solitary pulmonary nodule: Secondary | ICD-10-CM | POA: Diagnosis not present

## 2018-11-16 HISTORY — DX: Melanocytic nevi, unspecified: D22.9

## 2019-06-05 ENCOUNTER — Telehealth: Payer: Self-pay

## 2019-06-12 ENCOUNTER — Telehealth (INDEPENDENT_AMBULATORY_CARE_PROVIDER_SITE_OTHER): Payer: Managed Care, Other (non HMO) | Admitting: Internal Medicine

## 2019-06-12 ENCOUNTER — Encounter: Payer: Self-pay | Admitting: Internal Medicine

## 2019-06-12 VITALS — BP 106/68 | HR 85 | Ht 69.5 in | Wt 160.0 lb

## 2019-06-12 DIAGNOSIS — R911 Solitary pulmonary nodule: Secondary | ICD-10-CM

## 2019-06-12 DIAGNOSIS — I480 Paroxysmal atrial fibrillation: Secondary | ICD-10-CM

## 2019-06-12 NOTE — Progress Notes (Signed)
Electrophysiology TeleHealth Note   Due to national recommendations of social distancing due to COVID 19, an audio/video telehealth visit is felt to be most appropriate for this patient at this time.  See MyChart message from today for the patient's consent to telehealth for [facility].    Date:  06/12/2019   ID:  Adam Green, DOB 1981/04/07, MRN [id number]  Location: patient's home  Provider location:  [facility]  Evaluation Performed: Follow-up visit  PCP:  Donald Prose, MD   Electrophysiologist:  Dr Rayann Heman  Chief Complaint:  palpitations  History of Present Illness:    Adam Green is a 38 y.o. male who presents via telehealth conferencing today.  Since last being seen in our clinic, the patient reports doing very well.  He has rare palpitations.  He attributes this to stress.  His daughter has food sensitivity issues and he finds that this is very stressful for him.  Today, he denies symptoms of chest pain, shortness of breath,  lower extremity edema, dizziness, presyncope, or syncope.  The patient is otherwise without complaint today.  The patient denies symptoms of fevers, chills, cough, or new SOB worrisome for COVID 19.  Past Medical History:  Diagnosis Date  . A-fib ([place])   . 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 ELECTROPHYSIOLOGIC STUDY N/A 11/18/2016   afib ablation performed by Dr Rayann Heman  . FEMUR FRACTURE SURGERY Left   . FINGER SURGERY    . FOREIGN BODY REMOVAL Right 06/16/2016   Procedure: FOREIGN BODY REMOVAL;  Surgeon: Leanora Cover, MD;  Location: [place];  Service: Orthopedics;  Laterality: Right;  . ORIF ANKLE FRACTURE      Current Outpatient Medications  Medication Sig Dispense Refill  . acetaminophen (TYLENOL) 500 MG tablet Take 1,000 mg by mouth every 6 (six) hours as needed for headache.    . Multiple Vitamins-Minerals (MULTIVITAMIN WITH  MINERALS) tablet Take 1 tablet by mouth daily.     No current facility-administered medications for this visit.     Allergies:   Dexpak 10 day [dexamethasone] and Xarelto [rivaroxaban]   Social History:  The patient  reports that he quit smoking about 12 years ago. His smoking use included cigarettes. He has never used smokeless tobacco. He reports current alcohol use of about 3.0 standard drinks of alcohol per week. He reports that he does not use drugs.   Family History:  The patient is unaware of any health issues.  His father had a "silent heart attach"  ROS:  Please see the history of present illness.   All other systems are personally reviewed and negative.    Exam:    Vital Signs:  BP 106/68   Pulse 85   Ht 5' 9.5" (1.765 m)   Wt 160 lb (72.6 kg)   BMI 23.29 kg/m   Well appearing, NAD , normal WOB today   Labs/Other Tests and Data Reviewed:    Recent Labs: 08/08/2018: BUN 15; Creatinine, Ser 1.04; Magnesium 2.0; Potassium 4.3; Sodium 142   Wt Readings from Last 3 Encounters:  06/12/19 160 lb (72.6 kg)  08/08/18 175 lb (79.4 kg)  11/22/17 177 lb 6.4 oz (80.5 kg)      ASSESSMENT & PLAN:    1.  Paroxysmal atrial fibrillation Doing well post ablation off AAD therapy chads2vasc score is 0. I did offer ILR.  He is not interested at  this time. Stress avoidance and ETOH avoidance is encouraged   2. Pulmonary nodules CT 11/16/18 reviewed with patient.  Per Dr Clovis Riley "These are compatible with a benign etiology and no further follow-up is indicated at this time."  Follow-up:  6 months with AF clinic I will see in a year  Patient Risk:  after full review of this patients clinical status, I feel that they are at moderate risk at this time.  Today, I have spent 15 minutes with the patient with telehealth technology discussing arrhythmia management .    Army Fossa, MD  06/12/2019 11:27 AM     [place]  [phone] (office) [phone] (fax)

## 2019-09-25 DIAGNOSIS — R002 Palpitations: Secondary | ICD-10-CM

## 2019-09-25 DIAGNOSIS — I480 Paroxysmal atrial fibrillation: Secondary | ICD-10-CM

## 2019-10-17 ENCOUNTER — Telehealth: Payer: Self-pay | Admitting: *Deleted

## 2019-10-17 NOTE — Telephone Encounter (Signed)
[employer] to ship a 30 day cardiac event monitor to the patients home.  Instructions reviewed briefly as they are included in the monitor kit. 

## 2019-10-23 ENCOUNTER — Telehealth: Payer: Self-pay | Admitting: Internal Medicine

## 2019-10-23 NOTE — Telephone Encounter (Signed)
Patient calling to check on the status of his heart monitor. States it was to be shipped last week and he has still not received it.

## 2019-10-23 NOTE — Telephone Encounter (Signed)
[employer] was contacted.  Their shipping department stated patient should receive his monitor on Thursday or Friday.  Shipping department stated if we call later in the day they will be able to provide a UPS tracking number.  Patient given [employer] telephone number if he would like to track progress.

## 2019-10-28 ENCOUNTER — Ambulatory Visit (INDEPENDENT_AMBULATORY_CARE_PROVIDER_SITE_OTHER): Payer: Managed Care, Other (non HMO)

## 2019-10-28 DIAGNOSIS — R002 Palpitations: Secondary | ICD-10-CM | POA: Diagnosis not present

## 2019-10-28 DIAGNOSIS — I480 Paroxysmal atrial fibrillation: Secondary | ICD-10-CM

## 2019-12-05 ENCOUNTER — Other Ambulatory Visit: Payer: Self-pay

## 2019-12-05 ENCOUNTER — Ambulatory Visit (HOSPITAL_COMMUNITY)
Admission: RE | Admit: 2019-12-05 | Discharge: 2019-12-05 | Disposition: A | Discharge status: Home / Self Care | Payer: Managed Care, Other (non HMO) | Source: Ambulatory Visit | Attending: Nurse Practitioner | Admitting: Nurse Practitioner

## 2019-12-05 DIAGNOSIS — I480 Paroxysmal atrial fibrillation: Secondary | ICD-10-CM

## 2019-12-05 NOTE — Progress Notes (Signed)
Electrophysiology TeleHealth Note   Due to national recommendations of social distancing due to [place] 19, Audio  telehealth visit is felt to be most appropriate for this patient at this time.  See MyChart message/consent below  from today for patient consent regarding telehealth for the Atrial Fibrillation Clinic.    Date:  12/05/2019   ID:  ARLINGTON LONSWAY, DOB Jun 13, 1981, MRN [id number]  Location: home   Provider location: [address] [place], [place] [address] Evaluation Performed:  Follow up   PCP:  Donald Prose, MD  Primary Cardiologist: Dr. Rayann Heman Primary Electrophysiologist: Dr. Rayann Heman   CC: f/u monitor   History of Present Illness: Adam Green is a 38 y.o. male who presents via audio  conferencing for a telehealth visit today.  Pt has h/o  afib ablation 2017 and seen last by Dr. Rayann Heman in July. He was doing well at that time. In November, he noted some palpitations and he was sent a 30 monitor. The visit is to review monitor results. The monitor did not show any afib or arrhythmia's. A few PAC's were noted. Pt has noted some fatigue that the monitor  does not explain. Has seen PCP with normal labs. No sleep apnea, negative sleep study in 2017. CHA2DS2VASc score of 0, anticoagulation is not required.    Today, he denies symptoms of palpitations, chest pain, shortness of breath, orthopnea, PND, lower extremity edema, claudication, dizziness, presyncope, syncope, bleeding, or neurologic sequela. The patient is tolerating medications without difficulties and is otherwise without complaint today.   he denies symptoms of cough, fevers, chills, or new SOB worrisome for COVID 19.    Atrial Fibrillation Risk Factors:  he does not have symptoms or diagnosis of sleep apnea. he does not have a history of rheumatic fever.   he has a BMI of There is no height or weight on file to calculate BMI.. There were no vitals filed for this visit.  Past Medical History:    Diagnosis Date  . A-fib ([place])   . 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 ELECTROPHYSIOLOGIC STUDY N/A 11/18/2016   afib ablation performed by Dr Rayann Heman  . FEMUR FRACTURE SURGERY Left   . FINGER SURGERY    . FOREIGN BODY REMOVAL Right 06/16/2016   Procedure: FOREIGN BODY REMOVAL;  Surgeon: Leanora Cover, MD;  Location: [place];  Service: Orthopedics;  Laterality: Right;  . ORIF ANKLE FRACTURE       Current Outpatient Medications  Medication Sig Dispense Refill  . acetaminophen (TYLENOL) 500 MG tablet Take 1,000 mg by mouth every 6 (six) hours as needed for headache.    . Multiple Vitamins-Minerals (MULTIVITAMIN WITH MINERALS) tablet Take 1 tablet by mouth daily.     No current facility-administered medications for this encounter.    Allergies:   Dexpak 10 day [dexamethasone] and Xarelto [rivaroxaban]   Social History:  The patient  reports that he quit smoking about 13 years ago. His smoking use included cigarettes. He has never used smokeless tobacco. He reports current alcohol use of about 3.0 standard drinks of alcohol per week. He reports that he does not use drugs.   Family History:  The patient's   family history is not on file.    ROS:  Please see the history of present illness.   All other systems are personally reviewed and negative.   Exam: Na/phone visit  Recent Labs:  No results found for requested labs within last 8760 hours.  personally reviewed    Other studies personally reviewed: Additional studies/ records that were reviewed today include: 30 day monitor results reviewed Review of the above records today demonstrates: no afib on monitor, no significant arrhythmia, overall normal monitor results       ASSESSMENT AND PLAN:  1.  Paroxysmal  atrial fibrillation Monitor reviewed with pt and wife Reassured normal findings His symptom of fatigue is not explained by  monitor Has been evaluated with pcp as well with normal labs per pt   No other concerns today  F/u with Dr. Rayann Heman in 6 months    This patients CHA2DS2-VASc Score and unadjusted Ischemic Stroke Rate (% per year) is equal to 0.2 % stroke rate/year from a score of 0  Above score calculated as 1 point each if present [CHF, HTN, DM, Vascular=MI/PAD/Aortic Plaque, Age if 65-74, or Male] Above score calculated as 2 points each if present [Age > 75, or Stroke/TIA/TE]    Current medicines are reviewed at length with the patient today.   The patient does not have concerns regarding his medicines.  The following changes were made today:  none  Labs/ tests ordered today include: none  No orders of the defined types were placed in this encounter.   Patient Risk:  after full review of this patients clinical status, I feel that they are at  Low  risk at this time.   Today, I have spent 7 minutes with the patient with telehealth technology discussing monitor results .    Eduard Roux NP  12/05/2019 9:51 AM  Afib [facility] [address] [place], [place] [address] [phone]   I hereby voluntarily request, consent and authorize the [facility] and its employed or contracted physicians, physician assistants, nurse practitioners or other licensed health care professionals (the Practitioner), to provide me with telemedicine health care services (the "Services") as deemed necessary by the treating Practitioner. I acknowledge and consent to receive the Services by the Practitioner via telemedicine. I understand that the telemedicine visit will involve communicating with the Practitioner through live audiovisual communication technology and the disclosure of certain medical information by electronic transmission. I acknowledge that I have been given the opportunity to request an in-person assessment or other available alternative prior to the  telemedicine visit and am voluntarily participating in the telemedicine visit.   I understand that I have the right to withhold or withdraw my consent to the use of telemedicine in the course of my care at any time, without affecting my right to future care or treatment, and that the Practitioner or I may terminate the telemedicine visit at any time. I understand that I have the right to inspect all information obtained and/or recorded in the course of the telemedicine visit and may receive copies of available information for a reasonable fee.  I understand that some of the potential risks of receiving the Services via telemedicine include:   Delay or interruption in medical evaluation due to technological equipment failure or disruption;  Information transmitted may not be sufficient (e.g. poor resolution of images) to allow for appropriate medical decision making by the Practitioner; and/or  In rare instances, security protocols could fail, causing a breach of personal health information.   Furthermore, I acknowledge that it is my responsibility to provide information about my medical history, conditions and care that is complete and accurate to the best of my ability. I acknowledge  that Practitioner's advice, recommendations, and/or decision may be based on factors not within their control, such as incomplete or inaccurate data provided by me or distortions of diagnostic images or specimens that may result from electronic transmissions. I understand that the practice of medicine is not an exact science and that Practitioner makes no warranties or guarantees regarding treatment outcomes. I acknowledge that I will receive a copy of this consent concurrently upon execution via email to the email address I last provided but may also request a printed copy by calling the office of the [facility].  I understand that my insurance will be billed for this visit.   I have read or had this consent  read to me.  I understand the contents of this consent, which adequately explains the benefits and risks of the Services being provided via telemedicine.  I have been provided ample opportunity to ask questions regarding this consent and the Services and have had my questions answered to my satisfaction.  I give my informed consent for the services to be provided through the use of telemedicine in my medical care  By participating in this telemedicine visit I agree to the above.

## 2020-03-02 ENCOUNTER — Telehealth: Payer: Self-pay | Admitting: Internal Medicine

## 2020-03-02 NOTE — Telephone Encounter (Signed)
appt made for Thursday (pt request) had irregular/pounding heart beats all day yesterday was very fatigued after. Seems to happen more so in the mornings when he wakes up then subsides. Does not feel the monitor worn in December was able to capture the rhythm he continues to have. Would prefer to be seen in office. Wife is requesting an echo.

## 2020-03-02 NOTE — Telephone Encounter (Signed)
   Pt's wife calling, she said pt is having issue with his heart rhythm again and would like to speak with a nurse to discuss  please call

## 2020-03-05 ENCOUNTER — Ambulatory Visit (HOSPITAL_COMMUNITY)
Admission: RE | Admit: 2020-03-05 | Discharge: 2020-03-05 | Disposition: A | Discharge status: Home / Self Care | Payer: 59 | Source: Ambulatory Visit | Attending: Nurse Practitioner | Admitting: Nurse Practitioner

## 2020-03-05 ENCOUNTER — Other Ambulatory Visit: Payer: Self-pay

## 2020-03-05 ENCOUNTER — Encounter (HOSPITAL_COMMUNITY): Payer: Self-pay | Admitting: Nurse Practitioner

## 2020-03-05 VITALS — BP 106/64 | HR 89 | Ht 69.5 in | Wt 164.4 lb

## 2020-03-05 DIAGNOSIS — Z87891 Personal history of nicotine dependence: Secondary | ICD-10-CM | POA: Insufficient documentation

## 2020-03-05 DIAGNOSIS — R002 Palpitations: Secondary | ICD-10-CM | POA: Insufficient documentation

## 2020-03-05 DIAGNOSIS — I480 Paroxysmal atrial fibrillation: Secondary | ICD-10-CM

## 2020-03-05 LAB — BASIC METABOLIC PANEL
Anion gap: 8 (ref 5–15)
BUN: 15 mg/dL (ref 6–20)
CO2: 27 mmol/L (ref 22–32)
Calcium: 9.7 mg/dL (ref 8.9–10.3)
Chloride: 105 mmol/L (ref 98–111)
Creatinine, Ser: 0.9 mg/dL (ref 0.61–1.24)
GFR calc Af Amer: >60 mL/min (ref >60)
GFR calc non Af Amer: >60 mL/min (ref >60)
Glucose, Bld: 96 mg/dL (ref 70–99)
Potassium: 4.5 mmol/L (ref 3.5–5.1)
Sodium: 140 mmol/L (ref 135–145)

## 2020-03-05 LAB — MAGNESIUM: Magnesium: 2 mg/dL (ref 1.7–2.4)

## 2020-03-05 MED ORDER — DILTIAZEM HCL 30 MG PO TABS: ORAL_TABLET | ORAL | 1 refills | Status: DC

## 2020-03-05 NOTE — Progress Notes (Signed)
Primary Care Physician: Donald Prose, MD Referring Physician: Dr. Suezanne Jacquet Adam Green is a 39 y.o. male with a h/o afib s/p ablation 2017. He asked to be seen for irregular hart beat that was exceptionally  bad on Sunday afternoon to the point he felt very fatigued. Ekg today shows bigeminal PAC's. When he has these, it makes him feel very fatigued. Wife was present for the conversation on the phone and is very concerned. He wore a ZIo patch a few months back and there were no PAC's or afib present. He says that come and go very quickly. He states that he is not drinking any excessive amounts of caffeine or alcohol. He does lawn work/tree work for a living. He discussed with Dr. Rayann Heman on visit in December these symptoms and he offered a LInq, which was discussed today. Pt states he looked into it and it would be too expensive. He is currently taking OTC magnesium and does not tolerate BB's, "made me feel terrible " and does not want to revisit.  Today, he denies symptoms of palpitations, chest pain, shortness of breath, orthopnea, PND, lower extremity edema, dizziness, presyncope, syncope, or neurologic sequela. The patient is tolerating medications without difficulties and is otherwise without complaint today.   Past Medical History:  Diagnosis Date  . A-fib ([place])   . 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 ELECTROPHYSIOLOGIC STUDY N/A 11/18/2016   afib ablation performed by Dr Rayann Heman  . FEMUR FRACTURE SURGERY Left   . FINGER SURGERY    . FOREIGN BODY REMOVAL Right 06/16/2016   Procedure: FOREIGN BODY REMOVAL;  Surgeon: Leanora Cover, MD;  Location: [place];  Service: Orthopedics;  Laterality: Right;  . ORIF ANKLE FRACTURE      Current Outpatient Medications  Medication Sig Dispense Refill  . acetaminophen (TYLENOL) 500 MG tablet Take 1,000 mg by mouth as needed for headache.     . Multiple  Vitamins-Minerals (MULTIVITAMIN WITH MINERALS) tablet Take 1 tablet by mouth daily.     No current facility-administered medications for this encounter.    Allergies  Allergen Reactions  . Dexpak 10 Day [Dexamethasone]     "extra weight gain"  . Xarelto [Rivaroxaban]     Severe leg pain and insomnia    Social History   Socioeconomic History  . Marital status: Married    Spouse name: Not on file  . Number of children: Not on file  . Years of education: Not on file  . Highest education level: Not on file  Occupational History  . Not on file  Tobacco Use  . Smoking status: Former Smoker    Types: Cigarettes    Quit date: 10/26/2006    Years since quitting: 13.3  . Smokeless tobacco: Never Used  Substance and Sexual Activity  . Alcohol use: Yes    Alcohol/week: 3.0 standard drinks    Types: 3 Cans of beer per week    Comment: per week  . Drug use: No  . Sexual activity: Yes    Birth control/protection: None    Comment: 1 partner  Other Topics Concern  . Not on file  Social History Narrative  . Not on file   Social Determinants of Health   Financial Resource Strain:   . Difficulty of Paying Living Expenses:   Food Insecurity:   . Worried About Charity fundraiser in the Last Year:   .  Ran Out of Food in the Last Year:   Transportation Needs:   . Film/video editor (Medical):   Marland Kitchen Lack of Transportation (Non-Medical):   Physical Activity:   . Days of Exercise per Week:   . Minutes of Exercise per Session:   Stress:   . Feeling of Stress :   Social Connections:   . Frequency of Communication with Friends and Family:   . Frequency of Social Gatherings with Friends and Family:   . Attends Religious Services:   . Active Member of Clubs or Organizations:   . Attends Archivist Meetings:   Marland Kitchen Marital Status:   Intimate Partner Violence:   . Fear of Current or Ex-Partner:   . Emotionally Abused:   Marland Kitchen Physically Abused:   . Sexually Abused:     No  family history on file.  ROS- All systems are reviewed and negative except as per the HPI above  Physical Exam: Vitals:   03/05/20 0902  BP: 106/64  Pulse: 89  Weight: 74.6 kg  Height: 5' 9.5" (1.765 m)   Wt Readings from Last 3 Encounters:  03/05/20 74.6 kg  06/12/19 72.6 kg  08/08/18 79.4 kg    Labs: Lab Results  Component Value Date   NA 142 08/08/2018   K 4.3 08/08/2018   CL 104 08/08/2018   CO2 24 08/08/2018   GLUCOSE 90 08/08/2018   BUN 15 08/08/2018   CREATININE 1.04 08/08/2018   CALCIUM 9.5 08/08/2018   MG 2.0 08/08/2018   No results found for: INR Lab Results  Component Value Date   CHOL 142 11/07/2016   HDL 51 11/07/2016   LDLCALC 82 11/07/2016   TRIG 43 11/07/2016     GEN- The patient is well appearing, alert and oriented x 3 today.   Head- normocephalic, atraumatic Eyes-  Sclera clear, conjunctiva pink Ears- hearing intact Oropharynx- clear Neck- supple, no JVP Lymph- no cervical lymphadenopathy Lungs- Clear to ausculation bilaterally, normal work of breathing Heart- irregular  rhythm, no murmurs, rubs or gallops, PMI not laterally displaced GI- soft, NT, ND, + BS Extremities- no clubbing, cyanosis, or edema MS- no significant deformity or atrophy Skin- no rash or lesion Psych- euthymic mood, full affect Neuro- strength and sensation are intact  EKG-SR with bigeminal  PAC's, pr int 152 ms, qrs ms, 86 ms, qtc 411 ms zio patch 11/2019 did not show any afib or other arrhythmia     Assessment and Plan: 1. Palpitations Had a spell Sunday with irregular heart beat and fatigue , worse than most and cannot rule out episodes  of afib Today he is feeling palpitations and ekg shows bigeminal pac's Pt does not want to use BB's as they made him feel very fatigued in past Taking an OTC magnesium tablet  He is not interested in a linq for the cost  He and his wife are requesting an Echo as they are concerned for his fatigue associated with these  spells Scheduled  RX for 30 mg cardizem given for irregular episodes  Denies snoring history but will often wake up with palpitations  Bmet/mag today  PO 97%  2. CHA2DS2VASc score of 0 Not on anticoagulation per guidelines    Will move up appointment to discuss further with Dr. Lawrence Marseilles C. Raistlin Gum, [facility] [address] [place],  [address] [phone]

## 2020-03-10 ENCOUNTER — Ambulatory Visit (HOSPITAL_COMMUNITY)
Admission: RE | Admit: 2020-03-10 | Discharge: 2020-03-10 | Disposition: A | Discharge status: Home / Self Care | Payer: 59 | Source: Ambulatory Visit | Attending: Nurse Practitioner | Admitting: Nurse Practitioner

## 2020-03-10 ENCOUNTER — Other Ambulatory Visit: Payer: Self-pay

## 2020-03-10 DIAGNOSIS — I4891 Unspecified atrial fibrillation: Secondary | ICD-10-CM | POA: Insufficient documentation

## 2020-03-10 DIAGNOSIS — Z87891 Personal history of nicotine dependence: Secondary | ICD-10-CM | POA: Insufficient documentation

## 2020-03-10 DIAGNOSIS — I480 Paroxysmal atrial fibrillation: Secondary | ICD-10-CM

## 2020-03-10 NOTE — Progress Notes (Signed)
  Echocardiogram 2D Echocardiogram has been performed.  Jennette Dubin 03/10/2020, 3:51 PM

## 2020-03-11 ENCOUNTER — Other Ambulatory Visit (HOSPITAL_COMMUNITY): Payer: 59

## 2020-03-18 ENCOUNTER — Telehealth (INDEPENDENT_AMBULATORY_CARE_PROVIDER_SITE_OTHER): Payer: 59 | Admitting: Internal Medicine

## 2020-03-18 ENCOUNTER — Encounter: Payer: Self-pay | Admitting: Internal Medicine

## 2020-03-18 ENCOUNTER — Other Ambulatory Visit: Payer: Self-pay

## 2020-03-18 VITALS — Ht 69.5 in | Wt 160.0 lb

## 2020-03-18 DIAGNOSIS — R002 Palpitations: Secondary | ICD-10-CM | POA: Diagnosis not present

## 2020-03-18 DIAGNOSIS — I480 Paroxysmal atrial fibrillation: Secondary | ICD-10-CM

## 2020-03-18 NOTE — Progress Notes (Signed)
Electrophysiology TeleHealth Note   Due to national recommendations of social distancing due to COVID 19, an audio/video telehealth visit is felt to be most appropriate for this patient at this time.  See MyChart message from today for the patient's consent to telehealth for [facility].  Date:  03/18/2020   ID:  Adam Green, DOB [birth year], MRN [id number]  Location: patient's home  Provider location:  [facility]  Evaluation Performed: Follow-up visit  PCP:  Donald Prose, MD   Electrophysiologist:  Dr Rayann Heman  Chief Complaint:  palpitations  History of Present Illness:    Adam Green is a 39 y.o. male who presents via telehealth conferencing today.  Since last being seen in our clinic, the patient reports doing very well.  He continues to have intermittent palpitations.  Recently, he presented to the AF clinic and was found to have sinus with bigeminal PACs as the cause.  He was prescribed prn diltiazem but has not yet had this filled.  Today, he denies symptoms of chest pain, shortness of breath,  lower extremity edema, dizziness, presyncope, or syncope.    Past Medical History:  Diagnosis Date  . A-fib ([place])   . 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 ELECTROPHYSIOLOGIC STUDY N/A 11/18/2016   afib ablation performed by Dr Rayann Heman  . FEMUR FRACTURE SURGERY Left   . FINGER SURGERY    . FOREIGN BODY REMOVAL Right 06/16/2016   Procedure: FOREIGN BODY REMOVAL;  Surgeon: Leanora Cover, MD;  Location: [place];  Service: Orthopedics;  Laterality: Right;  . ORIF ANKLE FRACTURE      No current outpatient medications on file.   No current facility-administered medications for this visit.    Allergies:   Dexpak 10 day [dexamethasone] and Xarelto [rivaroxaban]   Social History:  The patient  reports that he quit smoking about 13 years ago. His smoking use included cigarettes. He  has never used smokeless tobacco. He reports current alcohol use of about 3.0 standard drinks of alcohol per week. He reports that he does not use drugs.   ROS:  Please see the history of present illness.   All other systems are personally reviewed and negative.    Exam:    Vital Signs:  Ht 5' 9.5" (1.765 m)   Wt 160 lb (72.6 kg)   BMI 23.29 kg/m   Well sounding and appearing, alert and conversant, regular work of breathing,  good skin color Eyes- anicteric, neuro- grossly intact, skin- no apparent rash or lesions or cyanosis, mouth- oral mucosa is pink  Labs/Other Tests and Data Reviewed:    Recent Labs: 03/05/2020: BUN 15; Creatinine, Ser 0.90; Magnesium 2.0; Potassium 4.5; Sodium 140   Wt Readings from Last 3 Encounters:  03/18/20 160 lb (72.6 kg)  03/05/20 164 lb 6.4 oz (74.6 kg)  06/12/19 160 lb (72.6 kg)    ekg from 03/05/2020 reveals sinus with PACs in bigeminy Echo reveals normal EF (50-55%)  ASSESSMENT & PLAN:    1.  Paroxysmal atrial fibrillation/ palpitations/ PACs afib has been controlled post ablation.  He has had frequent PACs.  I agree with Adam Palau NP that prn diltiazem is the best approach.  The patient will try this.  We also discussed lifestyle modification including stress reduction today   Follow-up:  6 months   Patient Risk:  after full review of this patients clinical status, I  feel that they are at moderate risk at this time.  Today, I have spent 15 minutes with the patient with telehealth technology discussing arrhythmia management .    Army Fossa, MD  03/18/2020 11:52 AM     [facility] [address] [place] [address] [phone] (office) [phone] (fax)

## 2020-06-22 ENCOUNTER — Ambulatory Visit: Payer: Managed Care, Other (non HMO) | Admitting: Internal Medicine

## 2020-09-10 ENCOUNTER — Telehealth: Payer: Self-pay | Admitting: Internal Medicine

## 2020-09-10 NOTE — Telephone Encounter (Signed)
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 Rayann Heman would need to provide to the doctor doing the physical.  Paperwork can be faxed to [facility] at [phone] Office can be reached by phone at [phone]  Please advise

## 2020-09-10 NOTE — Telephone Encounter (Signed)
Called the office of [facility] to find out if they need paperwork for this patients appt.  This is something that would need to come from their office initially for [place] to fill out if need be for the DOT visit not from our office.   With the limited time [place] office just requested the patients last visit note with Dr. Rayann Heman.  Called patient to get verbal consent to fax doctor Allred's last note to [place] office. Patient verbalized "yes, it is okay to fax my last visit note to there office".   Faxed paperwork and received conformation.

## 2020-09-30 ENCOUNTER — Telehealth (INDEPENDENT_AMBULATORY_CARE_PROVIDER_SITE_OTHER): Payer: 59 | Admitting: Internal Medicine

## 2020-09-30 ENCOUNTER — Encounter: Payer: Self-pay | Admitting: Internal Medicine

## 2020-09-30 ENCOUNTER — Other Ambulatory Visit: Payer: Self-pay

## 2020-09-30 VITALS — HR 71 | Ht 69.0 in | Wt 160.0 lb

## 2020-09-30 DIAGNOSIS — I480 Paroxysmal atrial fibrillation: Secondary | ICD-10-CM

## 2020-09-30 DIAGNOSIS — R002 Palpitations: Secondary | ICD-10-CM | POA: Diagnosis not present

## 2020-09-30 NOTE — Progress Notes (Signed)
Electrophysiology TeleHealth Note  Due to national recommendations of social distancing due to [place] 19, an audio telehealth visit is felt to be most appropriate for this patient at this time.  Verbal consent was obtained by me for the telehealth visit today.  The patient does not have capability for a virtual visit.  A phone visit is therefore required today.   Date:  09/30/2020   ID:  Adam Green, DOB [birth year], MRN [id number]  Location: patient's home  Provider location:  [facility]  Evaluation Performed: Follow-up visit  PCP:  Donald Prose, MD   Electrophysiologist:  Dr Rayann Heman  Chief Complaint:  palpitations  History of Present Illness:    Adam Green is a 39 y.o. male who presents via telehealth conferencing today.  Since last being seen in our clinic, the patient reports doing very well.  He has rare palpitations but feels that they are improved.  He has substantial life stress and anxiety which seems to trigger episodes.  His daughter has chronic illness.  He is also building a house.   Today, he denies symptoms of chest pain, shortness of breath,  lower extremity edema, dizziness, presyncope, or syncope.  The patient is otherwise without complaint today.     Past Medical History:  Diagnosis Date  . A-fib ([place])   . 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 ELECTROPHYSIOLOGIC STUDY N/A 11/18/2016   afib ablation performed by Dr Rayann Heman  . FEMUR FRACTURE SURGERY Left   . FINGER SURGERY    . FOREIGN BODY REMOVAL Right 06/16/2016   Procedure: FOREIGN BODY REMOVAL;  Surgeon: Leanora Cover, MD;  Location: [place];  Service: Orthopedics;  Laterality: Right;  . ORIF ANKLE FRACTURE      No current outpatient medications on file.   No current facility-administered medications for this visit.    Allergies:   Dexpak 10 day [dexamethasone] and Xarelto [rivaroxaban]   Social  History:  The patient  reports that he quit smoking about 13 years ago. His smoking use included cigarettes. He has never used smokeless tobacco. He reports current alcohol use of about 3.0 standard drinks of alcohol per week. He reports that he does not use drugs.   ROS:  Please see the history of present illness.   All other systems are personally reviewed and negative.    Exam:    Vital Signs:  Pulse 71   Ht 5\' 9"  (1.753 m)   Wt 160 lb (72.6 kg)   BMI 23.63 kg/m   Well sounding, alert and conversant   Labs/Other Tests and Data Reviewed:    Recent Labs: 03/05/2020: BUN 15; Creatinine, Ser 0.90; Magnesium 2.0; Potassium 4.5; Sodium 140   Wt Readings from Last 3 Encounters:  09/30/20 160 lb (72.6 kg)  03/18/20 160 lb (72.6 kg)  03/05/20 164 lb 6.4 oz (74.6 kg)     ASSESSMENT & PLAN:    1.  Paroxysmal atrial fibrillation/ PACs Doing very well currently chads2vasc score is 0.   Risks, benefits and potential toxicities for medications prescribed and/or refilled reviewed with patient today.   Follow-up:  with me in a year   Patient Risk:  after full review of this patients clinical status, I feel that they are at moderate risk at this time.  Today, I have spent 15 minutes with the patient with telehealth technology discussing arrhythmia management .  Army Fossa, MD  09/30/2020 9:48 AM     [place] [address] [phone] (office) [phone] (fax)

## 2021-01-06 ENCOUNTER — Other Ambulatory Visit: Payer: Self-pay

## 2021-01-06 ENCOUNTER — Emergency Department (HOSPITAL_COMMUNITY)
Admission: EM | Admit: 2021-01-06 | Discharge: 2021-01-06 | Disposition: A | Discharge status: Home / Self Care | Payer: BC Managed Care – PPO | Attending: Emergency Medicine | Admitting: Emergency Medicine

## 2021-01-06 ENCOUNTER — Encounter (HOSPITAL_COMMUNITY): Payer: Self-pay

## 2021-01-06 DIAGNOSIS — Z87891 Personal history of nicotine dependence: Secondary | ICD-10-CM | POA: Insufficient documentation

## 2021-01-06 DIAGNOSIS — Z23 Encounter for immunization: Secondary | ICD-10-CM | POA: Diagnosis not present

## 2021-01-06 DIAGNOSIS — W268XXA Contact with other sharp object(s), not elsewhere classified, initial encounter: Secondary | ICD-10-CM | POA: Diagnosis not present

## 2021-01-06 DIAGNOSIS — S6991XA Unspecified injury of right wrist, hand and finger(s), initial encounter: Secondary | ICD-10-CM | POA: Diagnosis present

## 2021-01-06 DIAGNOSIS — S61511A Laceration without foreign body of right wrist, initial encounter: Secondary | ICD-10-CM | POA: Insufficient documentation

## 2021-01-06 MED ORDER — LIDOCAINE HCL (PF) 2 % IJ SOLN
INTRAMUSCULAR | Status: AC
  Administered 2021-01-06: 5 mL
  Filled 2021-01-06: qty 20

## 2021-01-06 MED ORDER — LIDOCAINE HCL (PF) 2 % IJ SOLN: 10.0000 mL | Freq: Once | INTRAMUSCULAR | Status: AC

## 2021-01-06 MED ORDER — SODIUM BICARBONATE 4 % IV SOLN
5.0000 mL | INTRAVENOUS | Status: DC | PRN
  Filled 2021-01-06: qty 5

## 2021-01-06 MED ORDER — POVIDONE-IODINE 10 % EX SOLN
CUTANEOUS | Status: AC
  Administered 2021-01-06: 1
  Filled 2021-01-06: qty 15

## 2021-01-06 MED ORDER — TETANUS-DIPHTH-ACELL PERTUSSIS 5-2.5-18.5 LF-MCG/0.5 IM SUSY
0.5000 mL | PREFILLED_SYRINGE | Freq: Once | INTRAMUSCULAR | Status: AC
  Administered 2021-01-06: 0.5 mL via INTRAMUSCULAR
  Filled 2021-01-06: qty 0.5

## 2021-01-06 MED ORDER — POVIDONE-IODINE 10 % OINT PACKET
TOPICAL_OINTMENT | Freq: Once | CUTANEOUS | Status: DC
  Filled 2021-01-06: qty 28.35

## 2021-01-06 NOTE — ED Provider Notes (Signed)
[name] EMERGENCY DEPARTMENT Provider Note   CSN: [id number] Arrival date & time: 01/06/21  1020     History Chief Complaint  Patient presents with  . Laceration    Adam Green is a 40 y.o. male with a prior history of A. fib status post ablation, not on blood thinners, right-handed, presenting with laceration to his right wrist which occurred at work just prior to arrival.  He was cleaning up some scrap metal when his wrist was lacerated by a sharp sheet metal edge.  He is not current with his tetanus.  He denies numbness in his hand or fingertips, although he endorses pain at the wrist with movement of his thumb index and long fingers.  He has applied a dressing prior to arrival and the wound is hemostatic.  He has no other complaints at this time. His tetanus is not current.    HPI     Past Medical History:  Diagnosis Date  . A-fib ([place])   . 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 A-fib ([place]) 11/18/2016  . PAF (paroxysmal atrial fibrillation) ([place]) 09/02/2016    Past Surgical History:  Procedure Laterality Date  . ELECTROPHYSIOLOGIC STUDY N/A 11/18/2016   afib ablation performed by Dr Rayann Heman  . FEMUR FRACTURE SURGERY Left   . FINGER SURGERY    . FOREIGN BODY REMOVAL Right 06/16/2016   Procedure: FOREIGN BODY REMOVAL;  Surgeon: Leanora Cover, MD;  Location: [place];  Service: Orthopedics;  Laterality: Right;  . ORIF ANKLE FRACTURE         No family history on file.  Social History   Tobacco Use  . Smoking status: Former Smoker    Types: Cigarettes    Quit date: 10/26/2006    Years since quitting: 14.2  . Smokeless tobacco: Never Used  Substance Use Topics  . Alcohol use: Yes    Alcohol/week: 3.0 standard drinks    Types: 3 Cans of beer per week    Comment: per week  . Drug use: No    Home Medications Prior to Admission medications   Not on File     Allergies    Dexpak 10 day [dexamethasone] and Xarelto [rivaroxaban]  Review of Systems   Review of Systems  Constitutional: Negative for chills and fever.  Skin: Positive for wound.  Neurological: Negative for weakness and numbness.  All other systems reviewed and are negative.   Physical Exam Updated Vital Signs BP (!) 116/95   Pulse 85   Temp 98.2 F (36.8 C) (Oral)   Resp 14   Ht 5' 9.5" (1.765 m)   Wt 72.6 kg   SpO2 96%   BMI 23.29 kg/m   Physical Exam Constitutional:      Appearance: He is well-developed and well-nourished.  HENT:     Head: Normocephalic.  Cardiovascular:     Rate and Rhythm: Normal rate.  Pulmonary:     Effort: Pulmonary effort is normal.  Musculoskeletal:        General: No deformity. Normal range of motion.     Comments: FROM of wrist and fingers right hand without weakness.  Normal sensation, less than 2 sec cap refill.    Skin:    Findings: Laceration present.     Comments: 2 cm laceration medial wrist, subcutaneous, hemostatic, clean and slightly curved.  Can visualize top edge of muscle layer which appears intact.  No other deep structures including tendons visualized.  Neurological:     Mental Status: He is alert and oriented to person, place, and time.     Sensory: No sensory deficit.     ED Results / Procedures / Treatments   Labs (all labs ordered are listed, but only abnormal results are displayed) Labs Reviewed - No data to display  EKG None  Radiology No results found.  Procedures Procedures   LACERATION REPAIR Performed by: Evalee Jefferson Authorized by: Evalee Jefferson Consent: Verbal consent obtained. Risks and benefits: risks, benefits and alternatives were discussed Consent given by: patient Patient identity confirmed: provided demographic data Prepped and Draped in normal sterile fashion Wound explored  Laceration Location: right radial wrist  Laceration Length: 2cm  No Foreign Bodies seen or  palpated  Anesthesia: local infiltration  Local anesthetic: lidocaine 2% without epinephrine  Anesthetic total: 3 ml  Irrigation method: syringe Amount of cleaning: standard  Skin closure: prolene 4-0  Number of sutures: 4  Technique: simple interrupted  Patient tolerance: Patient tolerated the procedure well with no immediate complications.   Medications Ordered in ED Medications  Tdap (BOOSTRIX) injection 0.5 mL (0.5 mLs Intramuscular Given 01/06/21 1101)  lidocaine HCl (PF) (XYLOCAINE) 2 % injection 10 mL (5 mLs Other Given by Other 01/06/21 1159)  povidone-iodine (BETADINE) 10 % external solution (1 application  Given by Other 01/06/21 1158)    ED Course  I have reviewed the triage vital signs and the nursing notes.  Pertinent labs & imaging results that were available during my care of the patient were reviewed by me and considered in my medical decision making (see chart for details).    MDM Rules/Calculators/A&P                          Tetanus updated.  Wound care instructions given.  Pt advised to have sutures removed in 10 days,  Return here sooner for any signs of infection including redness, swelling, worse pain or drainage of pus.    Final Clinical Impression(s) / ED Diagnoses Final diagnoses:  Wrist laceration, right, initial encounter    Rx / DC Orders ED Discharge Orders    None       Landis Martins 01/07/21 4315    Varney Biles, MD 01/11/21 1557

## 2021-01-06 NOTE — ED Notes (Signed)
ED Provider at bedside. 

## 2021-01-06 NOTE — Discharge Instructions (Addendum)
Have your sutures removed in 10 days.  Keep your wound clean and dry,  Until a good scab forms (about 2 days) - you may then wash gently twice daily with mild soap and water, but dry completely after.  Get rechecked for any sign of infection (redness,  Swelling,  Increased pain or drainage of purulent fluid).

## 2021-01-06 NOTE — ED Triage Notes (Signed)
Pt presents to ED with laceration to right wrist from a piece of sheet metal.

## 2021-12-16 ENCOUNTER — Ambulatory Visit: Payer: BC Managed Care – PPO | Admitting: Physician Assistant

## 2022-02-14 ENCOUNTER — Other Ambulatory Visit: Payer: Self-pay

## 2022-02-14 ENCOUNTER — Other Ambulatory Visit: Payer: Self-pay | Admitting: Sports Medicine

## 2022-02-14 ENCOUNTER — Ambulatory Visit
Admission: RE | Admit: 2022-02-14 | Discharge: 2022-02-14 | Disposition: A | Discharge status: Home / Self Care | Payer: 59 | Source: Ambulatory Visit | Attending: Sports Medicine | Admitting: Sports Medicine

## 2022-02-14 DIAGNOSIS — M545 Low back pain, unspecified: Secondary | ICD-10-CM

## 2022-02-22 ENCOUNTER — Other Ambulatory Visit: Payer: Self-pay | Admitting: Sports Medicine

## 2022-02-22 DIAGNOSIS — M25551 Pain in right hip: Secondary | ICD-10-CM

## 2022-02-22 DIAGNOSIS — M25552 Pain in left hip: Secondary | ICD-10-CM

## 2022-03-18 ENCOUNTER — Ambulatory Visit
Admission: RE | Admit: 2022-03-18 | Discharge: 2022-03-18 | Disposition: A | Discharge status: Home / Self Care | Payer: 59 | Source: Ambulatory Visit | Attending: Sports Medicine | Admitting: Sports Medicine

## 2022-03-18 DIAGNOSIS — M25551 Pain in right hip: Secondary | ICD-10-CM

## 2022-03-18 DIAGNOSIS — M25552 Pain in left hip: Secondary | ICD-10-CM

## 2022-03-20 ENCOUNTER — Other Ambulatory Visit: Payer: 59

## 2022-03-29 ENCOUNTER — Ambulatory Visit: Payer: 59 | Attending: Sports Medicine

## 2022-03-29 DIAGNOSIS — M25552 Pain in left hip: Secondary | ICD-10-CM | POA: Diagnosis present

## 2022-03-29 NOTE — Therapy (Signed)
[place] [facility] ?[address] ?[place], [place], [address] ?Phone: [phone]   Fax:  [phone] ? ?Physical Therapy Evaluation ? ?Patient Details  ?Name: Adam Green ?MRN: [id number] ?Date of Birth: 07/03/1981 ?Referring Provider (PT): Sheppard Coil, MD ? ? ?Encounter Date: 03/29/2022 ? ? PT End of Session - 03/29/22 1517   ? ? Visit Number 1   ? Number of Visits 8   ? Date for PT Re-Evaluation 06/03/22   ? PT Start Time 9470   ? PT Stop Time 9628   ? PT Time Calculation (min) 35 min   ? Activity Tolerance Patient tolerated treatment well   ? Behavior During Therapy [facility] for tasks assessed/performed   ? ?  ?  ? ?  ? ? ?Past Medical History:  ?Diagnosis Date  ? A-fib ([place])   ? 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 Left Calf - Moderate  ? ? ?Past Surgical History:  ?Procedure Laterality Date  ? ELECTROPHYSIOLOGIC STUDY N/A 11/18/2016  ? afib ablation performed by Dr Rayann Heman  ? FEMUR FRACTURE SURGERY Left   ? FINGER SURGERY    ? FOREIGN BODY REMOVAL Right 06/16/2016  ? Procedure: FOREIGN BODY REMOVAL;  Surgeon: Leanora Cover, MD;  Location: [place] [facility];  Service: Orthopedics;  Laterality: Right;  ? ORIF ANKLE FRACTURE    ? ? ?There were no vitals filed for this visit. ? ? ? Subjective Assessment - 03/29/22 1517   ? ? Subjective Patient reports that he has experienced pain in both hips initially, but the right side is no longer hurting. However, his left hip pain continues, but it is not a severe as it was initially. He is does not know of any MOI that caused this pain. He has never had any pain like this before.   ? Limitations Sitting   ? How long can you sit comfortably? 15-20 minutes at most   ? Patient Stated Goals be able to drive to work without his hip hurting (1 hour 15 minutes)   ? Currently in Pain? Yes   ? Pain Score 8    ? Pain Location Hip   ? Pain Orientation Left   ? Pain Descriptors / Indicators Aching;Sharp   ? Pain Type Chronic pain   ? Pain Radiating  Towards rarely radiates down the leg   ? Pain Onset More than a month ago   ? Pain Frequency Constant   ? Aggravating Factors  sitting, driving, kneeling, walking up a hill   ? Pain Relieving Factors sitting in his recliner, heat, walking   ? 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 ?  ?  ? ?  ? ? ? ? ? [facility] PT Assessment - 03/29/22 0001   ? ?  ? Assessment  ? Medical Diagnosis Pain in left hip   ? Referring Provider (PT) Sheppard Coil, MD   ? Onset Date/Surgical Date --   couple months ago  ? Next MD Visit 2-3 weeks   ? Prior Therapy No   ?  ? Precautions  ? Precautions None   ?  ? Restrictions  ? Weight Bearing Restrictions No   ?  ? Balance Screen  ? Has the patient fallen in the past 6 months No   ? Has the patient had a decrease in activity level because of a fear of falling?  No   ?  Is the patient reluctant to leave their home because of a fear of falling?  No   ?  ? Home Environment  ? Living Environment Private residence   ?  ? Prior Function  ? Level of Independence Independent   ? Vocation Full time employment   ? 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  ? Cognition  ? Overall Cognitive Status Within Functional Limits for tasks assessed   ? Memory Appears intact   ? Awareness Appears intact   ? Problem Solving Appears intact   ?  ? Sensation  ? Additional Comments Patient reports no numbness or tingling   ?  ? ROM / Strength  ? AROM / PROM / Strength Strength;AROM   ?  ? AROM  ? AROM Assessment Site Hip   ? Right/Left Hip Left   ? Left Hip Flexion 112   ? Left Hip ABduction --   WFL  ?  ? Strength  ? Strength Assessment Site Hip;Knee   ? Right/Left Hip Right;Left   ? Right Hip Flexion 4+/5   ? Left Hip Flexion 4+/5   ? Right/Left Knee Right;Left   ? Right Knee Flexion 5/5   ? Right Knee Extension 5/5   ? Left Knee Flexion 4+/5   ? Left Knee Extension 5/5   ?  ? Palpation  ? Palpation comment  TTP: left piriformis (reproduced familiar pain)   ? ?  ?  ? ?  ? ? ? ? ? ? ? ? ? ? ? ? ? ?Objective measurements completed on examination: See above findings.  ? ? ? ? ? [facility] Adult PT Treatment/Exercise - 03/29/22 0001   ? ?  ? Exercises  ? Exercises Knee/Hip   ?  ? Knee/Hip Exercises: Stretches  ? Piriformis Stretch Left;4 reps;30 seconds   ?  ? Knee/Hip Exercises: Sidelying  ? Clams L hip; 20 reps   ?  ? Manual Therapy  ? Manual Therapy Soft tissue mobilization   ? Soft tissue mobilization left piriformis   ? ?  ?  ? ?  ? ? ? ? ? ? ? ? ? ? ? ? ? ? ? PT Long Term Goals - 03/29/22 1724   ? ?  ? PT LONG TERM GOAL #1  ? Title Patient will be independent with his HEP.   ? Time 4   ? Period Weeks   ? Status New   ? Target Date 04/26/22   ?  ? PT LONG TERM GOAL #2  ? Title Patient will be able to drive for at least one hour without being limited by his familiar hip pain.   ? Time 4   ? Period Weeks   ? Status New   ? Target Date 04/26/22   ?  ? PT LONG TERM GOAL #3  ? Title Patient will report being able to complete his critical job demands without his pain exceeding 5/10.   ? Time 4   ? Period Weeks   ? Status New   ? Target Date 04/26/22   ? ?  ?  ? ?  ? ? ? ? ? ? ? ? ? Plan - 03/29/22 1555   ? ? Clinical Impression Statement Patient is a 41 year old male presenting to physical therapy with left hip pain. He presented with moderate pain severity and low irritability. He exhibited no significant lower extremity strength or AROM deficits. However, his pain was  able to be reproduced with palpation to the left piriformis. Soft tissue mobilization to the area was able to moderately reduce his familiar symptoms. He was then provided a HEP which he was able to properly demonstrate. He reported that his hip felt a little better upon the conclusion of treatment. Recommend that he continue with skilled physical therapy to address his remaining impairments to return to his prior level of function.   ? Personal Factors and  Comorbidities Time since onset of injury/illness/exacerbation;Profession;Comorbidity 1   ? Comorbidities a-fib   ? Examination-Activity Limitations Sit   ? Examination-Participation Restrictions Occupation;Driving   ? Stability/Clinical Decision Making Stable/Uncomplicated   ? Clinical Decision Making Low   ? Rehab Potential Good   ? PT Frequency 2x / week   ? PT Duration 4 weeks   ? PT Treatment/Interventions Electrical Stimulation;Cryotherapy;Moist Heat;Neuromuscular re-education;Therapeutic exercise;Therapeutic activities;Functional mobility training;Patient/family education;Manual techniques;Dry needling;Passive range of motion;Taping;Joint Manipulations   ? PT Next Visit Plan recumbent bike, STM to piriformis, clams, and other hip strengthening interventions   ? PT Home Exercise Plan Access Code: [email or web address]  URL: [email or web address]  Date: 03/29/2022  Prepared by: Jacqulynn Cadet    Exercises  - Clamshell  - 1 x daily - 7 x weekly - 3 sets - 10 reps  - Supine Piriformis Stretch with Foot on Ground  - 1 x daily - 7 x weekly - 4 sets - 30 seconds hold   ? Consulted and Agree with Plan of Care Patient   ? ?  ?  ? ?  ? ? ?Patient will benefit from skilled therapeutic intervention in order to improve the following deficits and impairments:  Decreased activity tolerance, Pain ? ?Visit Diagnosis: ?Pain in left hip ? ? ? ? ?Problem List ?Patient Active Problem List  ? Diagnosis Date Noted  ? A-fib ([place]) 11/18/2016  ? PAF (paroxysmal atrial fibrillation) ([place]) 09/02/2016  ? ? ?Darlin Coco, PT ?03/29/2022, 5:30 PM ? ?[place] ?[facility] ?[place] ?[place], [place], [address] ?Phone: [phone]   Fax:  [phone] ? ?Name: Adam Green ?MRN: [id number] ?Date of Birth: Mar 06, 1981 ? ? ?

## 2022-04-05 ENCOUNTER — Ambulatory Visit: Payer: 59 | Admitting: Physician Assistant

## 2022-04-05 ENCOUNTER — Ambulatory Visit: Payer: 59 | Attending: Sports Medicine

## 2022-04-05 ENCOUNTER — Encounter: Payer: Self-pay | Admitting: Physician Assistant

## 2022-04-05 DIAGNOSIS — Z86018 Personal history of other benign neoplasm: Secondary | ICD-10-CM

## 2022-04-05 DIAGNOSIS — Z808 Family history of malignant neoplasm of other organs or systems: Secondary | ICD-10-CM

## 2022-04-05 DIAGNOSIS — M25552 Pain in left hip: Secondary | ICD-10-CM | POA: Diagnosis present

## 2022-04-05 DIAGNOSIS — Z1283 Encounter for screening for malignant neoplasm of skin: Secondary | ICD-10-CM

## 2022-04-05 NOTE — Therapy (Addendum)
[facility] [place], [place], [address] Phone: [phone]   Fax:  [phone]  Physical Therapy Treatment  Patient Details  Name: Adam Green MRN: [id number] Date of Birth: 10-Nov-1981 Referring Provider (PT): Sheppard Coil, MD   Encounter Date: 04/05/2022   PT End of Session - 04/05/22 1648     Visit Number 2    Number of Visits 8    Date for PT Re-Evaluation 06/03/22    PT Start Time 9678    PT Stop Time 9381    PT Time Calculation (min) 50 min    Activity Tolerance Patient tolerated treatment well    Behavior During Therapy [facility] for tasks assessed/performed             Past Medical History:  Diagnosis Date   A-fib ([place])    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   Subjective Assessment - 04/05/22 1646     Subjective Patient reports that his hip is hurting today. He tried doing his HEP, but that caused his hip to start hurting and he stopped doing these exercises.    Limitations Sitting    How long can you sit comfortably? 15-20 minutes at most    Patient Stated Goals be able to drive to work without his hip hurting (1 hour 15 minutes)    Currently in Pain? Yes    Pain Score 4     Pain Location Buttocks    Pain Orientation Left    Pain Onset More than a month ago    Multiple Pain Sites Yes    Pain Score 2    Pain Location Hip    Pain Orientation Left                               [facility] Adult PT Treatment/Exercise - 04/05/22 0001        Knee/Hip Exercises: Stretches   Passive Hamstring Stretch Right;Left;3 reps;30 seconds    ITB Stretch Left;3 reps;30 seconds      Knee/Hip Exercises: Machines for Strengthening   Cybex Knee Flexion 60# x 30 reps      Knee/Hip Exercises: Standing   Forward Lunges Both;20 reps   onto 6" box   Hip Extension Both;20 reps;Knee straight      Knee/Hip Exercises: Seated   Other Seated Knee/Hip Exercises IR/ER   20 reps each (LLE only)     Manual Therapy   Manual Therapy Soft tissue mobilization;Manual Traction    Soft tissue mobilization left piriformis and sustained pressure to proximal hamstring    Manual Traction LLE long axis distraction                          PT Long Term Goals - 03/29/22 1724       PT LONG TERM GOAL #1   Title Patient will be independent with his HEP.  Time 4    Period Weeks    Status New    Target Date 04/26/22      PT LONG TERM GOAL #2   Title Patient will be able to drive for at least one hour without being limited by his familiar hip pain.    Time 4    Period Weeks    Status New    Target Date 04/26/22      PT LONG TERM GOAL #3   Title Patient will report being able to complete his critical job demands without his pain exceeding 5/10.    Time 4    Period Weeks    Status New    Target Date 04/26/22                   Plan - 04/05/22 1709     Clinical Impression Statement Patient presented to treatment with mild left hip and gluteal pain. He was introduced to multiple new interventions with these able to slightly reduce his symptoms. However, manual therapy was the most effective at reducing his familiar pain with soft tissue mobilization and long axis distraction being equally effective. He was educated on slowly reintroducing his HEP to prevent from exacerbating his familiar pain. He reported feeling a little better when sitting upon the conclusion of treatment. He continues to require skilled physical therapy to  address his remaining impairments to return to his prior level of function.    Personal Factors and Comorbidities Time since onset of injury/illness/exacerbation;Profession;Comorbidity 1    Comorbidities a-fib    Examination-Activity Limitations Sit    Examination-Participation Restrictions Occupation;Driving    Stability/Clinical Decision Making Stable/Uncomplicated    Rehab Potential Good    PT Frequency 2x / week    PT Duration 4 weeks    PT Treatment/Interventions Electrical Stimulation;Cryotherapy;Moist Heat;Neuromuscular re-education;Therapeutic exercise;Therapeutic activities;Functional mobility training;Patient/family education;Manual techniques;Dry needling;Passive range of motion;Taping;Joint Manipulations    PT Next Visit Plan recumbent bike, STM to piriformis, clams, and other hip strengthening interventions    PT Home Exercise Plan Access Code: [email or web address]  URL: [email or web address]  Date: 03/29/2022  Prepared by: Jacqulynn Cadet    Exercises  - Clamshell  - 1 x daily - 7 x weekly - 3 sets - 10 reps  - Supine Piriformis Stretch with Foot on Ground  - 1 x daily - 7 x weekly - 4 sets - 30 seconds hold    Consulted and Agree with Plan of Care Patient             Patient will benefit from skilled therapeutic intervention in order to improve the following deficits and impairments:  Decreased activity tolerance, Pain  Visit Diagnosis: Pain in left hip     Problem List Patient Active Problem List   Diagnosis Date Noted   A-fib ([place]) 11/18/2016   PAF (paroxysmal atrial fibrillation) ([place]) 09/02/2016    Darlin Coco, PT 04/05/2022, 5:57 PM  [facility] [place], [place], [address] Phone: [phone]   Fax:  [phone]  Name: Adam Green MRN: [id number] Date of Birth: July 22, 1981  PHYSICAL THERAPY DISCHARGE SUMMARY  Visits from Start of Care: 2  Current functional level related to goals /  functional outcomes: Patient failed to complete his recommended POC and is being discharged at this time.    Remaining deficits: No significant change since his initial evaluation    Education / Equipment: HEP    Patient agrees to discharge. Patient goals were not met.  Patient is being discharged due to not returning since the last visit.  Jacqulynn Cadet, PT, DPT

## 2022-04-05 NOTE — Progress Notes (Signed)
? ?  New Patient ?  ?Subjective  ?Adam Green is a 41 y.o. male who presents for the following: Annual Exam (No real concerns- just wants checked- left abdomen and left arm. Personal history of atypical mole. Family history of melanoma but no non mole skin cancer. ). ? ? ?The following portions of the chart were reviewed this encounter and updated as appropriate:  Tobacco  Allergies  Meds  Problems  Med Hx  Surg Hx  Fam Hx   ?  ? ?Objective  ?Well appearing patient in no apparent distress; mood and affect are within normal limits. ? ?A full examination was performed including scalp, head, eyes, ears, nose, lips, neck, chest, axillae, abdomen, back, buttocks, bilateral upper extremities, bilateral lower extremities, hands, feet, fingers, toes, fingernails, and toenails. All findings within normal limits unless otherwise noted below. ? ?No atypical nevi or signs of NMSC noted at the time of the visit.  ? ? ?Assessment & Plan  ?Encounter for screening for malignant neoplasm of skin ? ?Yearly skin check ? ? ? ? ?I, Eyoel Throgmorton, PA-C, have reviewed all documentation's for this visit.  The documentation on 04/05/22 for the exam, diagnosis, procedures and orders are all accurate and complete. ?

## 2022-04-14 ENCOUNTER — Encounter: Payer: 59 | Admitting: *Deleted

## 2022-05-22 ENCOUNTER — Emergency Department (HOSPITAL_COMMUNITY): Payer: 59

## 2022-05-22 ENCOUNTER — Encounter (HOSPITAL_COMMUNITY): Payer: Self-pay

## 2022-05-22 ENCOUNTER — Other Ambulatory Visit: Payer: Self-pay

## 2022-05-22 ENCOUNTER — Emergency Department (HOSPITAL_COMMUNITY)
Admission: EM | Admit: 2022-05-22 | Discharge: 2022-05-22 | Disposition: A | Discharge status: Home / Self Care | Payer: 59 | Attending: Emergency Medicine | Admitting: Emergency Medicine

## 2022-05-22 DIAGNOSIS — M79645 Pain in left finger(s): Secondary | ICD-10-CM | POA: Insufficient documentation

## 2022-05-22 DIAGNOSIS — S62621B Displaced fracture of medial phalanx of left index finger, initial encounter for open fracture: Secondary | ICD-10-CM | POA: Insufficient documentation

## 2022-05-22 DIAGNOSIS — I4891 Unspecified atrial fibrillation: Secondary | ICD-10-CM | POA: Insufficient documentation

## 2022-05-22 DIAGNOSIS — S6992XA Unspecified injury of left wrist, hand and finger(s), initial encounter: Secondary | ICD-10-CM | POA: Diagnosis present

## 2022-05-22 DIAGNOSIS — W3400XA Accidental discharge from unspecified firearms or gun, initial encounter: Secondary | ICD-10-CM | POA: Diagnosis not present

## 2022-05-22 MED ORDER — FENTANYL CITRATE PF 50 MCG/ML IJ SOSY
50.0000 ug | PREFILLED_SYRINGE | Freq: Once | INTRAMUSCULAR | Status: AC
  Administered 2022-05-22: 50 ug via INTRAVENOUS
  Filled 2022-05-22: qty 1

## 2022-05-22 MED ORDER — OXYCODONE-ACETAMINOPHEN 5-325 MG PO TABS
1.0000 | ORAL_TABLET | Freq: Once | ORAL | Status: AC
  Administered 2022-05-22: 1 via ORAL
  Filled 2022-05-22: qty 1

## 2022-05-22 MED ORDER — CEPHALEXIN 500 MG PO CAPS: 500.0000 mg | ORAL_CAPSULE | Freq: Four times a day (QID) | ORAL | 0 refills | Status: AC

## 2022-05-22 MED ORDER — CEFAZOLIN SODIUM-DEXTROSE 1-4 GM/50ML-% IV SOLN
1.0000 g | Freq: Once | INTRAVENOUS | Status: AC
  Administered 2022-05-22: 1 g via INTRAVENOUS
  Filled 2022-05-22: qty 50

## 2022-05-22 MED ORDER — OXYCODONE-ACETAMINOPHEN 5-325 MG PO TABS: 1.0000 | ORAL_TABLET | ORAL | 0 refills | Status: DC | PRN

## 2022-05-22 NOTE — ED Notes (Signed)
Pt ambulatory without assistance.  
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
## 2022-05-24 ENCOUNTER — Other Ambulatory Visit (HOSPITAL_COMMUNITY): Payer: Self-pay

## 2022-05-24 ENCOUNTER — Encounter (HOSPITAL_COMMUNITY)
Admission: RE | Disposition: A | Discharge status: Home / Self Care | Payer: Self-pay | Source: Ambulatory Visit | Attending: Orthopedic Surgery

## 2022-05-24 ENCOUNTER — Encounter (HOSPITAL_COMMUNITY): Payer: Self-pay | Admitting: Orthopedic Surgery

## 2022-05-24 ENCOUNTER — Ambulatory Visit (HOSPITAL_COMMUNITY)
Admission: RE | Admit: 2022-05-24 | Discharge: 2022-05-24 | Disposition: A | Discharge status: Home / Self Care | Payer: 59 | Source: Ambulatory Visit | Attending: Orthopedic Surgery | Admitting: Orthopedic Surgery

## 2022-05-24 ENCOUNTER — Ambulatory Visit (HOSPITAL_COMMUNITY): Payer: 59 | Admitting: Certified Registered Nurse Anesthetist

## 2022-05-24 ENCOUNTER — Ambulatory Visit (HOSPITAL_COMMUNITY): Payer: 59

## 2022-05-24 ENCOUNTER — Ambulatory Visit (HOSPITAL_BASED_OUTPATIENT_CLINIC_OR_DEPARTMENT_OTHER): Payer: 59 | Admitting: Certified Registered Nurse Anesthetist

## 2022-05-24 ENCOUNTER — Other Ambulatory Visit: Payer: Self-pay

## 2022-05-24 DIAGNOSIS — I4891 Unspecified atrial fibrillation: Secondary | ICD-10-CM | POA: Insufficient documentation

## 2022-05-24 DIAGNOSIS — S61231A Puncture wound without foreign body of left index finger without damage to nail, initial encounter: Secondary | ICD-10-CM

## 2022-05-24 DIAGNOSIS — S62621B Displaced fracture of medial phalanx of left index finger, initial encounter for open fracture: Secondary | ICD-10-CM

## 2022-05-24 DIAGNOSIS — W3400XA Accidental discharge from unspecified firearms or gun, initial encounter: Secondary | ICD-10-CM | POA: Diagnosis not present

## 2022-05-24 DIAGNOSIS — Z87891 Personal history of nicotine dependence: Secondary | ICD-10-CM | POA: Insufficient documentation

## 2022-05-24 HISTORY — PX: OPEN REDUCTION INTERNAL FIXATION (ORIF) PROXIMAL PHALANX: SHX6235

## 2022-05-24 HISTORY — PX: I & D EXTREMITY: SHX5045

## 2022-05-24 LAB — CBC
HCT: 42.7 % (ref 39.0–52.0)
Hemoglobin: 14.7 g/dL (ref 13.0–17.0)
MCH: 31.1 pg (ref 26.0–34.0)
MCHC: 34.4 g/dL (ref 30.0–36.0)
MCV: 90.3 fL (ref 80.0–100.0)
Platelets: 197 10*3/uL (ref 150–400)
RBC: 4.73 MIL/uL (ref 4.22–5.81)
RDW: 12.3 % (ref 11.5–15.5)
WBC: 4.7 10*3/uL (ref 4.0–10.5)
nRBC: 0 % (ref 0.0–0.2)

## 2022-05-24 LAB — BASIC METABOLIC PANEL
Anion gap: 9 (ref 5–15)
BUN: 14 mg/dL (ref 6–20)
CO2: 22 mmol/L (ref 22–32)
Calcium: 9.3 mg/dL (ref 8.9–10.3)
Chloride: 108 mmol/L (ref 98–111)
Creatinine, Ser: 0.88 mg/dL (ref 0.61–1.24)
GFR, Estimated: >60 mL/min (ref >60)
Glucose, Bld: 98 mg/dL (ref 70–99)
Potassium: 5 mmol/L (ref 3.5–5.1)
Sodium: 139 mmol/L (ref 135–145)

## 2022-05-24 SURGERY — OPEN REDUCTION INTERNAL FIXATION (ORIF) PROXIMAL PHALANX
Anesthesia: Monitor Anesthesia Care | Site: Hand | Laterality: Left

## 2022-05-24 MED ORDER — FENTANYL CITRATE (PF) 250 MCG/5ML IJ SOLN
INTRAMUSCULAR | Status: AC
  Filled 2022-05-24: qty 5

## 2022-05-24 MED ORDER — PROPOFOL 10 MG/ML IV BOLUS
INTRAVENOUS | Status: DC | PRN
  Administered 2022-05-24: 200 mg via INTRAVENOUS

## 2022-05-24 MED ORDER — ONDANSETRON HCL 4 MG/2ML IJ SOLN
INTRAMUSCULAR | Status: DC | PRN
  Administered 2022-05-24: 4 mg via INTRAVENOUS

## 2022-05-24 MED ORDER — ACETAMINOPHEN 10 MG/ML IV SOLN
INTRAVENOUS | Status: AC
  Filled 2022-05-24: qty 100

## 2022-05-24 MED ORDER — FENTANYL CITRATE (PF) 100 MCG/2ML IJ SOLN: 25.0000 ug | INTRAMUSCULAR | Status: DC | PRN

## 2022-05-24 MED ORDER — LIDOCAINE HCL (PF) 1 % IJ SOLN
INTRAMUSCULAR | Status: AC
Start: 2022-05-24 — End: ?
  Filled 2022-05-24: qty 30

## 2022-05-24 MED ORDER — FENTANYL CITRATE (PF) 100 MCG/2ML IJ SOLN
25.0000 ug | INTRAMUSCULAR | Status: DC | PRN
  Administered 2022-05-24 (×2): 50 ug via INTRAVENOUS

## 2022-05-24 MED ORDER — CEFAZOLIN SODIUM-DEXTROSE 2-4 GM/100ML-% IV SOLN
2.0000 g | INTRAVENOUS | Status: AC
  Administered 2022-05-24: 2 g via INTRAVENOUS
  Filled 2022-05-24: qty 100

## 2022-05-24 MED ORDER — BUPIVACAINE HCL (PF) 0.25 % IJ SOLN
INTRAMUSCULAR | Status: AC
  Filled 2022-05-24: qty 30

## 2022-05-24 MED ORDER — PROPOFOL 10 MG/ML IV BOLUS
INTRAVENOUS | Status: AC
  Filled 2022-05-24: qty 20

## 2022-05-24 MED ORDER — LACTATED RINGERS IV SOLN
INTRAVENOUS | Status: DC
Start: 2022-05-24 — End: 2022-05-24

## 2022-05-24 MED ORDER — CHLORHEXIDINE GLUCONATE 0.12 % MT SOLN
15.0000 mL | Freq: Once | OROMUCOSAL | Status: AC
  Administered 2022-05-24: 15 mL via OROMUCOSAL
  Filled 2022-05-24: qty 15

## 2022-05-24 MED ORDER — ACETAMINOPHEN 160 MG/5ML PO SOLN: 1000.0000 mg | Freq: Once | ORAL | Status: DC | PRN

## 2022-05-24 MED ORDER — ACETAMINOPHEN 10 MG/ML IV SOLN
1000.0000 mg | Freq: Once | INTRAVENOUS | Status: DC | PRN
  Administered 2022-05-24: 1000 mg via INTRAVENOUS

## 2022-05-24 MED ORDER — ACETAMINOPHEN 10 MG/ML IV SOLN: 1000.0000 mg | Freq: Once | INTRAVENOUS | Status: DC | PRN

## 2022-05-24 MED ORDER — MIDAZOLAM HCL 2 MG/2ML IJ SOLN
INTRAMUSCULAR | Status: DC | PRN
  Administered 2022-05-24: 2 mg via INTRAVENOUS

## 2022-05-24 MED ORDER — OXYCODONE HCL 5 MG PO TABS
5.0000 mg | ORAL_TABLET | Freq: Four times a day (QID) | ORAL | 0 refills | Status: DC | PRN
  Filled 2022-05-24: qty 15, 4d supply, fill #0

## 2022-05-24 MED ORDER — BACITRACIN ZINC 500 UNIT/GM EX OINT
TOPICAL_OINTMENT | CUTANEOUS | Status: DC | PRN
  Administered 2022-05-24: 1 via TOPICAL

## 2022-05-24 MED ORDER — ACETAMINOPHEN 500 MG PO TABS: 1000.0000 mg | ORAL_TABLET | Freq: Once | ORAL | Status: DC | PRN

## 2022-05-24 MED ORDER — SODIUM CHLORIDE 0.9 % IR SOLN
Status: DC | PRN
  Administered 2022-05-24: 3000 mL

## 2022-05-24 MED ORDER — OXYCODONE HCL 5 MG PO TABS: 5.0000 mg | ORAL_TABLET | Freq: Once | ORAL | Status: DC | PRN

## 2022-05-24 MED ORDER — ORAL CARE MOUTH RINSE: 15.0000 mL | Freq: Once | OROMUCOSAL | Status: AC

## 2022-05-24 MED ORDER — PHENYLEPHRINE 80 MCG/ML (10ML) SYRINGE FOR IV PUSH (FOR BLOOD PRESSURE SUPPORT)
PREFILLED_SYRINGE | INTRAVENOUS | Status: DC | PRN
  Administered 2022-05-24: 80 ug via INTRAVENOUS

## 2022-05-24 MED ORDER — DEXAMETHASONE SODIUM PHOSPHATE 10 MG/ML IJ SOLN
INTRAMUSCULAR | Status: DC | PRN
  Administered 2022-05-24: 10 mg via INTRAVENOUS

## 2022-05-24 MED ORDER — OXYCODONE HCL 5 MG/5ML PO SOLN: 5.0000 mg | Freq: Once | ORAL | Status: DC | PRN

## 2022-05-24 MED ORDER — LIDOCAINE 2% (20 MG/ML) 5 ML SYRINGE
INTRAMUSCULAR | Status: DC | PRN
  Administered 2022-05-24: 60 mg via INTRAVENOUS

## 2022-05-24 MED ORDER — FENTANYL CITRATE (PF) 250 MCG/5ML IJ SOLN
INTRAMUSCULAR | Status: DC | PRN
  Administered 2022-05-24: 50 ug via INTRAVENOUS

## 2022-05-24 MED ORDER — BACITRACIN ZINC 500 UNIT/GM EX OINT
TOPICAL_OINTMENT | CUTANEOUS | Status: AC
  Filled 2022-05-24: qty 28.35

## 2022-05-24 MED ORDER — FENTANYL CITRATE (PF) 100 MCG/2ML IJ SOLN
INTRAMUSCULAR | Status: AC
  Filled 2022-05-24: qty 2

## 2022-05-24 MED ORDER — MIDAZOLAM HCL 2 MG/2ML IJ SOLN
INTRAMUSCULAR | Status: AC
Start: 2022-05-24 — End: ?
  Filled 2022-05-24: qty 2

## 2022-05-24 MED ORDER — BUPIVACAINE HCL (PF) 0.25 % IJ SOLN
INTRAMUSCULAR | Status: DC | PRN
  Administered 2022-05-24: 10 mL

## 2022-05-24 SURGICAL SUPPLY — 46 items
BAG COUNTER SPONGE SURGICOUNT (BAG) ×2 IMPLANT
BNDG COHESIVE 1X5 TAN STRL LF (GAUZE/BANDAGES/DRESSINGS) ×1 IMPLANT
BNDG CONFORM 2 STRL LF (GAUZE/BANDAGES/DRESSINGS) ×1 IMPLANT
BNDG ELASTIC 3X5.8 VLCR STR LF (GAUZE/BANDAGES/DRESSINGS) ×2 IMPLANT
BNDG ESMARK 4X9 LF (GAUZE/BANDAGES/DRESSINGS) ×2 IMPLANT
CANISTER SUCT 3000ML PPV (MISCELLANEOUS) ×2 IMPLANT
CORD BIPOLAR FORCEPS 12FT (ELECTRODE) ×2 IMPLANT
COVER SURGICAL LIGHT HANDLE (MISCELLANEOUS) ×2 IMPLANT
CUFF TOURN SGL QUICK 18X4 (TOURNIQUET CUFF) ×2 IMPLANT
DERMABOND ADVANCED (GAUZE/BANDAGES/DRESSINGS) ×1
DERMABOND ADVANCED .7 DNX12 (GAUZE/BANDAGES/DRESSINGS) IMPLANT
DRAPE OEC MINIVIEW 54X84 (DRAPES) ×2 IMPLANT
DRAPE U-SHAPE 48X52 POLY STRL (PACKS) ×1 IMPLANT
DRSG EMULSION OIL 3X3 NADH (GAUZE/BANDAGES/DRESSINGS) ×2 IMPLANT
GAUZE SPONGE 4X4 12PLY STRL (GAUZE/BANDAGES/DRESSINGS) ×2 IMPLANT
GAUZE XEROFORM 1X8 LF (GAUZE/BANDAGES/DRESSINGS) ×2 IMPLANT
GLOVE BIO SURGEON STRL SZ7.5 (GLOVE) ×2 IMPLANT
GLOVE SS BIOGEL STRL SZ 8 (GLOVE) ×1 IMPLANT
GLOVE SUPERSENSE BIOGEL SZ 8 (GLOVE) ×1
GLOVE SURG UNDER POLY LF SZ7.5 (GLOVE) ×5 IMPLANT
GOWN STRL REUS W/ TWL LRG LVL3 (GOWN DISPOSABLE) ×3 IMPLANT
GOWN STRL REUS W/ TWL XL LVL3 (GOWN DISPOSABLE) ×1 IMPLANT
GOWN STRL REUS W/TWL LRG LVL3 (GOWN DISPOSABLE) ×3
GOWN STRL REUS W/TWL XL LVL3 (GOWN DISPOSABLE) ×1
GUIDEWIRE ORTH 1.1XCAN SCR SYS (WIRE) IMPLANT
HIBICLENS CHG 4% 4OZ BTL (MISCELLANEOUS) ×2 IMPLANT
K-WIRE 1.1 (WIRE) ×1
KIT BASIN OR (CUSTOM PROCEDURE TRAY) ×2 IMPLANT
KIT TURNOVER KIT B (KITS) ×2 IMPLANT
MANIFOLD NEPTUNE II (INSTRUMENTS) ×2 IMPLANT
NS IRRIG 1000ML POUR BTL (IV SOLUTION) ×2 IMPLANT
PACK ORTHO EXTREMITY (CUSTOM PROCEDURE TRAY) ×2 IMPLANT
PAD ARMBOARD 7.5X6 YLW CONV (MISCELLANEOUS) ×4 IMPLANT
PAD CAST 4YDX4 CTTN HI CHSV (CAST SUPPLIES) ×1 IMPLANT
PADDING CAST COTTON 4X4 STRL (CAST SUPPLIES) ×1
PUTTY DBM STAGRAFT PLUS 2CC (Putty) ×1 IMPLANT
SET CYSTO W/LG BORE CLAMP LF (SET/KITS/TRAYS/PACK) ×1 IMPLANT
SPLINT FINGER 4.25 BULB 911906 (SOFTGOODS) ×1 IMPLANT
SPONGE T-LAP 4X18 ~~LOC~~+RFID (SPONGE) ×2 IMPLANT
SUT CHROMIC 5 0 PS 3 (SUTURE) ×1 IMPLANT
TOWEL GREEN STERILE (TOWEL DISPOSABLE) ×2 IMPLANT
TOWEL GREEN STERILE FF (TOWEL DISPOSABLE) ×2 IMPLANT
TUBE CONNECTING 12X1/4 (SUCTIONS) ×2 IMPLANT
UNDERPAD 30X36 HEAVY ABSORB (UNDERPADS AND DIAPERS) ×2 IMPLANT
WATER STERILE IRR 1000ML POUR (IV SOLUTION) ×2 IMPLANT
YANKAUER SUCT BULB TIP NO VENT (SUCTIONS) ×2 IMPLANT

## 2022-05-24 NOTE — Transfer of Care (Signed)
Immediate Anesthesia Transfer of Care Note  Patient: Adam Green  Procedure(s) Performed: OPEN REDUCTION INTERNAL FIXATION (ORIF) LEFT INDEX FINGER MIDDLE PHALANX FRACTURE (Left: Hand) LEFT INDEX FINGER IRRIGATION AND DEBRIDEMENTOF OPEN MIDDLE PHALANX FRACTURE; NEUROLYSIS; TENOLYSIS (Left: Hand)  Patient Location: PACU  Anesthesia Type:General  Level of Consciousness: drowsy, patient cooperative and responds to stimulation  Airway & Oxygen Therapy: Patient Spontanous Breathing and Patient connected to face mask oxygen  Post-op Assessment: Report given to RN and Post -op Vital signs reviewed and stable  Post vital signs: Reviewed and stable  Last Vitals:  Vitals Value Taken Time  BP 116/90 05/24/22 1336  Temp    Pulse 78 05/24/22 1336  Resp 16 05/24/22 1337  SpO2 97 % 05/24/22 1336  Vitals shown include unvalidated device data.  Last Pain:  Vitals:   05/24/22 1039  TempSrc:   PainSc: 6       Patients Stated Pain Goal: 2 (51/70/01 7494)  Complications: No notable events documented.

## 2022-05-24 NOTE — Discharge Instructions (Signed)
  Orthopaedic Hand Surgery Discharge Instructions  WEIGHT BEARING STATUS: Non weight bearing on operative extremity  DRESSING CARE: Please keep your dressing/splint/cast clean and dry until your follow-up appointment. You may shower by placing a waterproof covering over your dressing/splint/cast. Contact your surgeon if your splint/cast gets wet. It will need to be changed to prevent skin breakdown.  PAIN CONTROL: First line medications for post operative pain control are Tylenol (acetaminophen) and Motrin (ibuprofen) if you are able to take these medications. If you have been prescribed a medication these can be taken as breakthrough pain medications. Please note that some narcotic pain medication has acetaminophen added and you should never consume more than 4,000mg of acetaminophen in 24-hour period. Please note that if you are given Toradol (ketorolac) you should not take similar medications such as ibuprofen or naproxen.  DISCHARGE MEDICATIONS: If you have been prescribed medication it was sent electronically to your pharmacy. No changes have been made to your home medications.  ICE/ELEVATION: Ice and elevate your injured extremity as needed. Avoid direct contact of ice with skin.   BANDAGE FEELS TOO TIGHT: If your bandage feels too tight, first make sure you are elevating your fingers as much as possible. The outer layer of the bandage can be unwrapped and reapplied more loosely. If no improvement, you may carefully cut the inner layer longitudinally until the pressure has resolved and then rewrap the outer layer. If you are not comfortable with these instructions, please call the office and the bandage can be changed for you.   FOLLOW UP: You will be called after surgery with an appointment date and time, however if you have not received a phone call within 3 days, please call during regular office hours at [phone] to schedule a post operative appointment.  Please Seek Medical Attention  if: Call MD for: pain or pressure in chest, jaw, arm, back, neck  Call MD for: temperature greater than 101 F for more than 24 hrs Call MD for: difficulty breathing Call MD for: incision redness, bleeding, drainage  Call MD for: palpitations or feeling that the heart is racing  Call MD for: increased swelling in arm, leg, ankle, or abdomen  Call MD for: lightheadedness, dizziness, fainting Call 911 or go to ER for any medical emergency if you are not able to get in touch with your doctor   J. Reid Sherlynn Tourville, MD Orthopaedic Hand Surgeon [facility] Office number: [phone] [address] [place], [place] [address]  

## 2022-05-24 NOTE — Anesthesia Procedure Notes (Signed)
Procedure Name: LMA Insertion Date/Time: 05/24/2022 12:26 PM  Performed by: Janace Litten, CRNAPre-anesthesia Checklist: Patient identified, Emergency Drugs available, Suction available and Patient being monitored Patient Re-evaluated:Patient Re-evaluated prior to induction Oxygen Delivery Method: Circle System Utilized Preoxygenation: Pre-oxygenation with 100% oxygen Induction Type: IV induction Ventilation: Mask ventilation without difficulty LMA: LMA inserted LMA Size: 4.0 Number of attempts: 1 Airway Equipment and Method: Bite block Placement Confirmation: positive ETCO2 Tube secured with: Tape Dental Injury: Teeth and Oropharynx as per pre-operative assessment  Comments: Placed by Lysle Dingwall Timcheck
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
## 2022-05-25 ENCOUNTER — Encounter (HOSPITAL_COMMUNITY): Payer: Self-pay | Admitting: Orthopedic Surgery

## 2022-05-25 NOTE — Anesthesia Postprocedure Evaluation (Signed)
Anesthesia Post Note  Patient: Adam Green  Procedure(s) Performed: OPEN REDUCTION INTERNAL FIXATION (ORIF) LEFT INDEX FINGER MIDDLE PHALANX FRACTURE (Left: Hand) LEFT INDEX FINGER IRRIGATION AND DEBRIDEMENTOF OPEN MIDDLE PHALANX FRACTURE; NEUROLYSIS; TENOLYSIS (Left: Hand)     Patient location during evaluation: PACU Anesthesia Type: Regional and MAC Level of consciousness: awake and alert Pain management: pain level controlled Vital Signs Assessment: post-procedure vital signs reviewed and stable Respiratory status: spontaneous breathing, nonlabored ventilation, respiratory function stable and patient connected to nasal cannula oxygen Cardiovascular status: stable and blood pressure returned to baseline Postop Assessment: no apparent nausea or vomiting Anesthetic complications: no   No notable events documented.  Last Vitals:  Vitals:   05/24/22 1405 05/24/22 1420  BP: 113/84 105/79  Pulse: 61 (!) 57  Resp: 15 14  Temp:  36.4 C  SpO2: 98% 99%    Last Pain:  Vitals:   05/24/22 1420  TempSrc:   PainSc: 3                  Journe Hallmark
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
## 2023-01-12 ENCOUNTER — Encounter (HOSPITAL_COMMUNITY): Payer: Self-pay | Admitting: *Deleted

## 2023-02-27 DIAGNOSIS — M25521 Pain in right elbow: Secondary | ICD-10-CM | POA: Diagnosis not present

## 2023-02-28 DIAGNOSIS — M25521 Pain in right elbow: Secondary | ICD-10-CM | POA: Diagnosis not present

## 2023-03-02 ENCOUNTER — Encounter (HOSPITAL_BASED_OUTPATIENT_CLINIC_OR_DEPARTMENT_OTHER): Payer: Self-pay | Admitting: Orthopedic Surgery

## 2023-03-02 ENCOUNTER — Other Ambulatory Visit: Payer: Self-pay

## 2023-03-02 DIAGNOSIS — M25521 Pain in right elbow: Secondary | ICD-10-CM | POA: Diagnosis not present

## 2023-03-02 DIAGNOSIS — S46291A Other injury of muscle, fascia and tendon of other parts of biceps, right arm, initial encounter: Secondary | ICD-10-CM | POA: Diagnosis not present

## 2023-03-05 NOTE — H&P (Signed)
Preoperative History & Physical Exam  Surgeon: Matt Holmes, MD  Diagnosis: Tear of distal tendon of biceps brachii, right  Planned Procedure: Procedure(s) (LRB): DISTAL BICEPS TENDON REPAIR (Right)  History of Present Illness:   Patient is a 42 y.o. male with symptoms consistent with Tear of distal tendon of biceps brachii, right, who presents for surgical intervention. The risks, benefits and alternatives of surgical intervention were discussed and informed consent was obtained prior to surgery.  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 - Moderate    Past Surgical History:  Past Surgical History:  Procedure Laterality Date   ELECTROPHYSIOLOGIC STUDY N/A 11/18/2016   afib ablation performed by Dr Rayann Heman   FEMUR FRACTURE SURGERY Left    FINGER SURGERY     FOREIGN BODY REMOVAL Right 06/16/2016   Procedure: FOREIGN BODY REMOVAL;  Surgeon: Leanora Cover, MD;  Location: [place];  Service: Orthopedics;  Laterality: Right;   I & D EXTREMITY Left 05/24/2022   Procedure: LEFT INDEX FINGER IRRIGATION AND DEBRIDEMENTOF OPEN MIDDLE PHALANX FRACTURE; NEUROLYSIS; TENOLYSIS;  Surgeon: Orene Desanctis, MD;  Location: [place];  Service: Orthopedics;  Laterality: Left;   OPEN REDUCTION INTERNAL FIXATION (ORIF) PROXIMAL PHALANX Left 05/24/2022   Procedure: OPEN REDUCTION INTERNAL FIXATION (ORIF) LEFT INDEX FINGER MIDDLE PHALANX FRACTURE;  Surgeon: Orene Desanctis, MD;  Location: [place];  Service: Orthopedics;  Laterality: Left;   ORIF ANKLE FRACTURE      Medications:  Prior to Admission medications   Medication Sig Start Date End Date Taking? Authorizing Provider  meloxicam (MOBIC) 7.5 MG tablet Take 7.5 mg by mouth daily.   Yes [provider]  oxyCODONE (ROXICODONE) 5 MG immediate release tablet Take 1 tablet (5 mg total) by mouth every 6 (six) hours as needed for severe pain. 05/24/22   Orene Desanctis, MD    Allergies:  Dexpak 10 day [dexamethasone] and Xarelto [rivaroxaban]  Review of Systems: Negative except per HPI.  Physical Exam: Alert and oriented, NAD Head and neck: no masses, normal alignment CV: pulse intact Pulm: no increased work of breathing, respirations even and unlabored Abdomen: non-distended Extremities: extremities warm and well perfused  LABS: No results found for this or any previous visit (from the past 2160 hour(s)).   Complete History and Physical exam available in the office notes  Orene Desanctis

## 2023-03-07 ENCOUNTER — Ambulatory Visit (HOSPITAL_BASED_OUTPATIENT_CLINIC_OR_DEPARTMENT_OTHER): Payer: BC Managed Care – PPO | Admitting: Certified Registered"

## 2023-03-07 ENCOUNTER — Encounter (HOSPITAL_BASED_OUTPATIENT_CLINIC_OR_DEPARTMENT_OTHER)
Admission: RE | Disposition: A | Discharge status: Home / Self Care | Payer: Self-pay | Source: Ambulatory Visit | Attending: Orthopedic Surgery

## 2023-03-07 ENCOUNTER — Ambulatory Visit (HOSPITAL_BASED_OUTPATIENT_CLINIC_OR_DEPARTMENT_OTHER): Payer: BC Managed Care – PPO

## 2023-03-07 ENCOUNTER — Ambulatory Visit (HOSPITAL_BASED_OUTPATIENT_CLINIC_OR_DEPARTMENT_OTHER)
Admission: RE | Admit: 2023-03-07 | Discharge: 2023-03-07 | Disposition: A | Discharge status: Home / Self Care | Payer: BC Managed Care – PPO | Source: Ambulatory Visit | Attending: Orthopedic Surgery | Admitting: Orthopedic Surgery

## 2023-03-07 ENCOUNTER — Other Ambulatory Visit: Payer: Self-pay

## 2023-03-07 ENCOUNTER — Encounter (HOSPITAL_BASED_OUTPATIENT_CLINIC_OR_DEPARTMENT_OTHER): Payer: Self-pay | Admitting: Orthopedic Surgery

## 2023-03-07 DIAGNOSIS — Z87891 Personal history of nicotine dependence: Secondary | ICD-10-CM | POA: Insufficient documentation

## 2023-03-07 DIAGNOSIS — S46211A Strain of muscle, fascia and tendon of other parts of biceps, right arm, initial encounter: Secondary | ICD-10-CM | POA: Diagnosis not present

## 2023-03-07 DIAGNOSIS — X58XXXA Exposure to other specified factors, initial encounter: Secondary | ICD-10-CM | POA: Insufficient documentation

## 2023-03-07 DIAGNOSIS — I4891 Unspecified atrial fibrillation: Secondary | ICD-10-CM | POA: Diagnosis not present

## 2023-03-07 DIAGNOSIS — S46291A Other injury of muscle, fascia and tendon of other parts of biceps, right arm, initial encounter: Secondary | ICD-10-CM | POA: Diagnosis not present

## 2023-03-07 HISTORY — PX: DISTAL BICEPS TENDON REPAIR: SHX1461

## 2023-03-07 SURGERY — REPAIR, TENDON, BICEPS, DISTAL
Anesthesia: Regional | Site: Arm Lower | Laterality: Right

## 2023-03-07 MED ORDER — PROMETHAZINE HCL 25 MG/ML IJ SOLN: 6.2500 mg | INTRAMUSCULAR | Status: DC | PRN

## 2023-03-07 MED ORDER — PROPOFOL 500 MG/50ML IV EMUL
INTRAVENOUS | Status: DC | PRN
  Administered 2023-03-07: 120 ug/kg/min via INTRAVENOUS

## 2023-03-07 MED ORDER — ATROPINE SULFATE 0.4 MG/ML IV SOLN
INTRAVENOUS | Status: AC
  Filled 2023-03-07: qty 1

## 2023-03-07 MED ORDER — CEFAZOLIN SODIUM-DEXTROSE 2-4 GM/100ML-% IV SOLN
2.0000 g | INTRAVENOUS | Status: AC
  Administered 2023-03-07: 2 g via INTRAVENOUS

## 2023-03-07 MED ORDER — OXYCODONE HCL 5 MG/5ML PO SOLN: 5.0000 mg | Freq: Once | ORAL | Status: DC | PRN

## 2023-03-07 MED ORDER — MIDAZOLAM HCL 2 MG/2ML IJ SOLN
2.0000 mg | Freq: Once | INTRAMUSCULAR | Status: AC
  Administered 2023-03-07: 2 mg via INTRAVENOUS

## 2023-03-07 MED ORDER — ACETAMINOPHEN 500 MG PO TABS
1000.0000 mg | ORAL_TABLET | Freq: Once | ORAL | Status: AC
  Administered 2023-03-07: 1000 mg via ORAL

## 2023-03-07 MED ORDER — ONDANSETRON HCL 4 MG/2ML IJ SOLN
INTRAMUSCULAR | Status: DC | PRN
  Administered 2023-03-07: 4 mg via INTRAVENOUS

## 2023-03-07 MED ORDER — MIDAZOLAM HCL 2 MG/2ML IJ SOLN
INTRAMUSCULAR | Status: AC
  Filled 2023-03-07: qty 2

## 2023-03-07 MED ORDER — KETOROLAC TROMETHAMINE 30 MG/ML IJ SOLN: 30.0000 mg | Freq: Once | INTRAMUSCULAR | Status: DC | PRN

## 2023-03-07 MED ORDER — LIDOCAINE 2% (20 MG/ML) 5 ML SYRINGE
INTRAMUSCULAR | Status: DC | PRN
  Administered 2023-03-07: 60 mg via INTRAVENOUS

## 2023-03-07 MED ORDER — ACETAMINOPHEN 500 MG PO TABS
ORAL_TABLET | ORAL | Status: AC
  Filled 2023-03-07: qty 2

## 2023-03-07 MED ORDER — BUPIVACAINE-EPINEPHRINE (PF) 0.5% -1:200000 IJ SOLN
INTRAMUSCULAR | Status: DC | PRN
  Administered 2023-03-07: 30 mL via PERINEURAL

## 2023-03-07 MED ORDER — PROPOFOL 10 MG/ML IV BOLUS
INTRAVENOUS | Status: DC | PRN
  Administered 2023-03-07: 30 mg via INTRAVENOUS
  Administered 2023-03-07 (×2): 20 mg via INTRAVENOUS

## 2023-03-07 MED ORDER — OXYCODONE-ACETAMINOPHEN 5-325 MG PO TABS: 1.0000 | ORAL_TABLET | Freq: Four times a day (QID) | ORAL | 0 refills | Status: AC | PRN

## 2023-03-07 MED ORDER — LACTATED RINGERS IV SOLN: INTRAVENOUS | Status: DC

## 2023-03-07 MED ORDER — SUCCINYLCHOLINE CHLORIDE 200 MG/10ML IV SOSY
PREFILLED_SYRINGE | INTRAVENOUS | Status: AC
  Filled 2023-03-07: qty 10

## 2023-03-07 MED ORDER — FENTANYL CITRATE (PF) 100 MCG/2ML IJ SOLN: 100.0000 ug | Freq: Once | INTRAMUSCULAR | Status: DC

## 2023-03-07 MED ORDER — 0.9 % SODIUM CHLORIDE (POUR BTL) OPTIME
TOPICAL | Status: DC | PRN
  Administered 2023-03-07: 1000 mL

## 2023-03-07 MED ORDER — BACITRACIN ZINC 500 UNIT/GM EX OINT
TOPICAL_OINTMENT | CUTANEOUS | Status: DC | PRN
  Administered 2023-03-07: 1 via TOPICAL

## 2023-03-07 MED ORDER — AMISULPRIDE (ANTIEMETIC) 5 MG/2ML IV SOLN: 10.0000 mg | Freq: Once | INTRAVENOUS | Status: DC | PRN

## 2023-03-07 MED ORDER — FENTANYL CITRATE (PF) 100 MCG/2ML IJ SOLN
INTRAMUSCULAR | Status: AC
  Filled 2023-03-07: qty 2

## 2023-03-07 MED ORDER — FENTANYL CITRATE (PF) 100 MCG/2ML IJ SOLN: 25.0000 ug | INTRAMUSCULAR | Status: DC | PRN

## 2023-03-07 MED ORDER — DEXMEDETOMIDINE HCL IN NACL 80 MCG/20ML IV SOLN
INTRAVENOUS | Status: DC | PRN
  Administered 2023-03-07 (×2): 8 ug via BUCCAL

## 2023-03-07 MED ORDER — CEFAZOLIN SODIUM-DEXTROSE 2-4 GM/100ML-% IV SOLN
INTRAVENOUS | Status: AC
  Filled 2023-03-07: qty 100

## 2023-03-07 MED ORDER — OXYCODONE HCL 5 MG PO TABS: 5.0000 mg | ORAL_TABLET | Freq: Once | ORAL | Status: DC | PRN

## 2023-03-07 SURGICAL SUPPLY — 67 items
ADH SKN CLS APL DERMABOND .7 (GAUZE/BANDAGES/DRESSINGS)
ANCH BUTTON FIBERTAK KNEE SP (Anchor) IMPLANT
ANCH SUT FBRTK BTN KN (Anchor) ×1 IMPLANT
BLADE CLIPPER SURG (BLADE) IMPLANT
BLADE SURG 15 STRL LF DISP TIS (BLADE) ×1 IMPLANT
BLADE SURG 15 STRL SS (BLADE) ×2
BNDG CMPR 5X3 KNIT ELC UNQ LF (GAUZE/BANDAGES/DRESSINGS) ×1
BNDG CMPR 9X4 STRL LF SNTH (GAUZE/BANDAGES/DRESSINGS) ×1
BNDG ELASTIC 3INX 5YD STR LF (GAUZE/BANDAGES/DRESSINGS) ×1 IMPLANT
BNDG ELASTIC 4X5.8 VLCR STR LF (GAUZE/BANDAGES/DRESSINGS) ×1 IMPLANT
BNDG ESMARK 4X9 LF (GAUZE/BANDAGES/DRESSINGS) ×1 IMPLANT
CLIP TI LARGE 6 (CLIP) IMPLANT
CLIP TI MEDIUM 6 (CLIP) IMPLANT
CLIP TI WIDE RED SMALL 6 (CLIP) IMPLANT
CORD BIPOLAR FORCEPS 12FT (ELECTRODE) ×1 IMPLANT
COVER BACK TABLE 60X90IN (DRAPES) ×1 IMPLANT
CUFF TOURN SGL QUICK 18X4 (TOURNIQUET CUFF) ×1 IMPLANT
DERMABOND ADVANCED .7 DNX12 (GAUZE/BANDAGES/DRESSINGS) IMPLANT
DRAPE EXTREMITY T 121X128X90 (DISPOSABLE) ×1 IMPLANT
DRAPE SURG 17X23 STRL (DRAPES) ×1 IMPLANT
DRSG EMULSION OIL 3X3 NADH (GAUZE/BANDAGES/DRESSINGS) ×1 IMPLANT
GAUZE 4X4 16PLY ~~LOC~~+RFID DBL (SPONGE) ×1 IMPLANT
GAUZE SPONGE 4X4 12PLY STRL (GAUZE/BANDAGES/DRESSINGS) ×1 IMPLANT
GLOVE BIO SURGEON STRL SZ7.5 (GLOVE) ×1 IMPLANT
GLOVE BIOGEL PI IND STRL 7.5 (GLOVE) ×1 IMPLANT
GOWN STRL REUS W/TWL LRG LVL3 (GOWN DISPOSABLE) ×1 IMPLANT
KIT KNEE FIBERTAK DISP (KITS) IMPLANT
KIT TURNOVER KIT B (KITS) ×1 IMPLANT
LOOP VASCLR MAXI BLUE 18IN ST (MISCELLANEOUS) IMPLANT
LOOP VASCULAR MAXI 18 BLUE (MISCELLANEOUS) ×1
LOOPS VASCLR MAXI BLUE 18IN ST (MISCELLANEOUS) ×1 IMPLANT
NDL HYPO 22X1.5 SAFETY MO (MISCELLANEOUS) IMPLANT
NDL MAYO TROCAR (NEEDLE) IMPLANT
NEEDLE HYPO 22X1.5 SAFETY MO (MISCELLANEOUS) IMPLANT
NEEDLE MAYO TROCAR (NEEDLE) ×1 IMPLANT
NS IRRIG 500ML POUR BTL (IV SOLUTION) ×1 IMPLANT
PACK BASIN DAY SURGERY FS (CUSTOM PROCEDURE TRAY) ×1 IMPLANT
PAD CAST 4YDX4 CTTN HI CHSV (CAST SUPPLIES) ×2 IMPLANT
PADDING CAST COTTON 4X4 STRL (CAST SUPPLIES) ×2
PADDING CAST SYNTHETIC 4X4 STR (CAST SUPPLIES) ×1 IMPLANT
RETRIEVER SUT HEWSON (MISCELLANEOUS) IMPLANT
SHEET MEDIUM DRAPE 40X70 STRL (DRAPES) ×1 IMPLANT
SLEEVE SCD COMPRESS KNEE MED (STOCKING) ×1 IMPLANT
SLING ARM FOAM STRAP LRG (SOFTGOODS) ×1 IMPLANT
SPIKE FLUID TRANSFER (MISCELLANEOUS) IMPLANT
SPLINT FIBERGLASS 3X35 (CAST SUPPLIES) ×1 IMPLANT
SPLINT FIBERGLASS 4X30 (CAST SUPPLIES) IMPLANT
STRIP CLOSURE SKIN 1/2X4 (GAUZE/BANDAGES/DRESSINGS) IMPLANT
SUCTION FRAZIER HANDLE 10FR (MISCELLANEOUS) ×1
SUCTION TUBE FRAZIER 10FR DISP (MISCELLANEOUS) IMPLANT
SUT ETHILON 4 0 PS 2 18 (SUTURE) ×1 IMPLANT
SUT VIC AB 0 CT1 27 (SUTURE) ×1
SUT VIC AB 0 CT1 27XBRD ANBCTR (SUTURE) ×1 IMPLANT
SUT VIC AB 2-0 SH 27 (SUTURE)
SUT VIC AB 2-0 SH 27XBRD (SUTURE) IMPLANT
SUT VIC AB 3-0 PS1 18 (SUTURE) ×1
SUT VIC AB 3-0 PS1 18XBRD (SUTURE) ×1 IMPLANT
SUT VIC AB 4-0 PS2 27 (SUTURE) IMPLANT
SUTURE TAPE 1.3 FIBERLOP 20 ST (SUTURE) IMPLANT
SUTURETAPE 1.3 FIBERLOOP 20 ST (SUTURE) ×1
SYR 10ML LL (SYRINGE) IMPLANT
SYR BULB EAR ULCER 3OZ GRN STR (SYRINGE) ×1 IMPLANT
TAPE SURG TRANSPORE 1 IN (GAUZE/BANDAGES/DRESSINGS) ×1 IMPLANT
TOWEL GREEN STERILE FF (TOWEL DISPOSABLE) ×2 IMPLANT
TUBE CONNECTING 20X1/4 (TUBING) IMPLANT
UNDERPAD 30X36 HEAVY ABSORB (UNDERPADS AND DIAPERS) ×1 IMPLANT
VASCULAR TIE MAXI BLUE 18IN ST (MISCELLANEOUS) ×1

## 2023-03-07 NOTE — Interval H&P Note (Signed)
History and Physical Interval Note:  03/07/2023 11:08 AM  Dixon  has presented today for surgery, with the diagnosis of Tear of distal tendon of biceps brachii.  The various methods of treatment have been discussed with the patient and family. After consideration of risks, benefits and other options for treatment, the patient has consented to  Procedure(s): [place] (Right) as a surgical intervention.  The patient's history has been reviewed, patient examined, no change in status, stable for surgery.  I have reviewed the patient's chart and labs.  Questions were answered to the patient's satisfaction.     Orene Desanctis

## 2023-03-07 NOTE — Op Note (Signed)
OPERATIVE NOTE   DATE OF PROCEDURE: 03/07/2023    SURGEONS:  Primary: Orene Desanctis, MD   PREOPERATIVE DIAGNOSIS: Right elbow distal biceps rupture   POSTOPERATIVE DIAGNOSIS: Same   NAME OF PROCEDURE: Right elbow distal biceps repair   ANESTHESIA: Regional Nerve Block + MAC   SKIN PREPARATION: Hibiclens   ESTIMATED BLOOD LOSS: 10 mL   IMPLANTS: Arthrex Fibertak Button Implant Name Type Inv. Item Serial No. Manufacturer Lot No. LRB No. Used Action  [place] - [id number] [employer] [facility] Christiane Ha KNEE SP  [place] [id number] Right 1 Implanted     INDICATIONS: Adam Green is a 42 y.o. male who has the preoperative diagnosis of right distal biceps rupture. The patient has decided to proceed with surgical intervention.  Risks, benefits and alternatives of operative management were discussed including, but not limited to, risks of anesthesia complications, infection, pain, persistent symptoms, stiffness, re-rupture, lateral antebrachial cutaneous nerve injury, need for future surgery.  The patient understands, agrees and elects to proceed with surgery.     DESCRIPTION OF PROCEDURE: The patient was met in the pre-operative area and their identity was verified.  The operative location and laterality was also verified and marked.  The patient was brought to the OR and was placed supine on the table.  After repeat patient identification with the operative team, anesthesia was provided and the patient was prepped and draped in the usual sterile fashion.  A final timeout was performed verifying the correction patient, procedure, location and laterality.   Preoperative antibiotics were provided, the right upper extremity was elevated, exsanguinated and tourniquet inflated to 273mmHg. The surgery began by making an incision centered over the right radial tuberosity in the proximal forearm.  Skin and subcutaneous tissues were divided and careful hemostasis was obtained.   Branches of the lateral antebrachial cutaneous nerve were protected and the antebrachial fascia was entered. The branches of the radial recurrent artery were protected and careful hemostasis was obtained throughout approach to the radial tuberosity. Retractors were placed, taking care to protect the posterior lateral aspect of the proximal radius to protect the posterior interosseous nerve.  At this time, the biceps tendon stump was identified via blunt dissection and delivered through the wound. The distal aspect of the tendon was trimmed free of scar tissue and to the appropriate shape. A #2 loop suture whipstitch was placed.  The radial tuberosity was prepared with a ronguer to healthy bleeding bone. At this time the guidepin was placed in a unicortical fashion with the forearm in full supination. Thorough irrigation with normal saline and suction was utilized to remove all bone reamings. The guidepin was checked on x-ray to confirm adequate placement within the radial tuberosity at the center of the anatomic footprint of the biceps tendon. The Fibertak all-suture button was deployed in a unicortical fashion. The whipstitch sutures were shuttled through the implant and the distal biceps tendon was tensioned down to the tuberosity. The tendon was confirmed to be flush to the cortex of the radial tuberosity. A free needle was used to pass one limb back through the tendon and a series of square knots with alternating posts were placed with excellent fixation. The tendon was fixed to the bone at maximum tension in 30 degrees of elbow flexion and full supination. The tourniquet was deflated and hemostasis was again meticulously obtained.  The wound was thoroughly irrigated and the wound was closed with 3-0 Vicryl suture followed by 4-0 nylon sutures. A sterile  soft dressing was applied followed by a long-arm splint. The hand remained pink and warm and well-perfused at the end of the case.  All counts were correct x2.  The patient tolerated the procedure well and was brought to the postanesthesia care unit in stable condition.   Matt Holmes, MD Orthopaedic Hand and Upper Extremity Surgeon

## 2023-03-07 NOTE — Anesthesia Postprocedure Evaluation (Signed)
Anesthesia Post Note  Patient: Adam Green  Procedure(s) Performed: DISTAL BICEPS TENDON REPAIR (Right: Arm Lower)     Patient location during evaluation: PACU Anesthesia Type: Regional Level of consciousness: awake Pain management: pain level controlled Vital Signs Assessment: post-procedure vital signs reviewed and stable Respiratory status: spontaneous breathing, nonlabored ventilation and respiratory function stable Cardiovascular status: blood pressure returned to baseline and stable Postop Assessment: no apparent nausea or vomiting Anesthetic complications: no   No notable events documented.  Last Vitals:  Vitals:   03/07/23 1300 03/07/23 1315  BP: 105/64 133/69  Pulse: (!) 57 60  Resp: 14 16  Temp:  (!) 36.4 C  SpO2: 98% 97%    Last Pain:  Vitals:   03/07/23 1315  TempSrc:   PainSc: 0-No pain                 Lidie Glade P Delainey Winstanley

## 2023-03-07 NOTE — Progress Notes (Signed)
Assisted Dr. Ellender with right, supraclavicular, ultrasound guided block. Side rails up, monitors on throughout procedure. See vital signs in flow sheet. Tolerated Procedure well. 

## 2023-03-07 NOTE — Discharge Instructions (Addendum)
Orthopaedic Hand Surgery Discharge Instructions  WEIGHT BEARING STATUS: Non weight bearing on operative extremity  DRESSING CARE: Please keep your dressing/splint/cast clean and dry until your follow-up appointment. You may shower by placing a waterproof covering over your dressing/splint/cast. Contact your surgeon if your splint/cast gets wet. It will need to be changed to prevent skin breakdown.  PAIN CONTROL: First line medications for post operative pain control are Tylenol (acetaminophen) and Motrin (ibuprofen) if you are able to take these medications. If you have been prescribed a medication these can be taken as breakthrough pain medications. Please note that some narcotic pain medication has acetaminophen added and you should never consume more than 4,000mg  of acetaminophen in 24-hour period. Please note that if you are given Toradol (ketorolac) you should not take similar medications such as ibuprofen or naproxen.  DISCHARGE MEDICATIONS: If you have been prescribed medication it was sent electronically to your pharmacy. No changes have been made to your home medications.  ICE/ELEVATION: Ice and elevate your injured extremity as needed. Avoid direct contact of ice with skin.   BANDAGE FEELS TOO TIGHT: If your bandage feels too tight, first make sure you are elevating your fingers as much as possible. The outer layer of the bandage can be unwrapped and reapplied more loosely. If no improvement, you may carefully cut the inner layer longitudinally until the pressure has resolved and then rewrap the outer layer. If you are not comfortable with these instructions, please call the office and the bandage can be changed for you.   FOLLOW UP: You will be called after surgery with an appointment date and time, however if you have not received a phone call within 3 days, please call during regular office hours at [phone] to schedule a post operative appointment.  Please Seek Medical Attention  if: Call MD for: pain or pressure in chest, jaw, arm, back, neck  Call MD for: temperature greater than 101 F for more than 24 hrs Call MD for: difficulty breathing Call MD for: incision redness, bleeding, drainage  Call MD for: palpitations or feeling that the heart is racing  Call MD for: increased swelling in arm, leg, ankle, or abdomen  Call MD for: lightheadedness, dizziness, fainting Call 911 or go to ER for any medical emergency if you are not able to get in touch with your doctor   J. Sable Feil, MD Orthopaedic Hand Surgeon [facility] Office number: [phone] [address], [address] [place], [place] [address]  Next dose of Tylenol due at 4:45 today if needed  Post Anesthesia Home Care Instructions  Activity: Get plenty of rest for the remainder of the day. A responsible individual must stay with you for 24 hours following the procedure.  For the next 24 hours, DO NOT: -Drive a car -Paediatric nurse -Drink alcoholic beverages -Take any medication unless instructed by your physician -Make any legal decisions or sign important papers.  Meals: Start with liquid foods such as gelatin or soup. Progress to regular foods as tolerated. Avoid greasy, spicy, heavy foods. If nausea and/or vomiting occur, drink only clear liquids until the nausea and/or vomiting subsides. Call your physician if vomiting continues.  Special Instructions/Symptoms: Your throat may feel dry or sore from the anesthesia or the breathing tube placed in your throat during surgery. If this causes discomfort, gargle with warm salt water. The discomfort should disappear within 24 hours.  If you had a scopolamine patch placed behind your ear for the management of post- operative nausea and/or vomiting:  1. The medication in the patch is effective for 72 hours, after which it should be removed.  Wrap patch in a tissue and discard in the trash. Wash hands thoroughly with soap and water. 2. You may remove  the patch earlier than 72 hours if you experience unpleasant side effects which may include dry mouth, dizziness or visual disturbances. 3. Avoid touching the patch. Wash your hands with soap and water after contact with the patch.    Regional Anesthesia Blocks  1. Numbness or the inability to move the "blocked" extremity may last from 3-48 hours after placement. The length of time depends on the medication injected and your individual response to the medication. If the numbness is not going away after 48 hours, call your surgeon.  2. The extremity that is blocked will need to be protected until the numbness is gone and the  Strength has returned. Because you cannot feel it, you will need to take extra care to avoid injury. Because it may be weak, you may have difficulty moving it or using it. You may not know what position it is in without looking at it while the block is in effect.  3. For blocks in the legs and feet, returning to weight bearing and walking needs to be done carefully. You will need to wait until the numbness is entirely gone and the strength has returned. You should be able to move your leg and foot normally before you try and bear weight or walk. You will need someone to be with you when you first try to ensure you do not fall and possibly risk injury.  4. Bruising and tenderness at the needle site are common side effects and will resolve in a few days.  5. Persistent numbness or new problems with movement should be communicated to the surgeon or the [place] [phone] [place] [phone]).

## 2023-03-07 NOTE — Anesthesia Procedure Notes (Signed)
Anesthesia Regional Block: Supraclavicular block   Pre-Anesthetic Checklist: , timeout performed,  Correct Patient, Correct Site, Correct Laterality,  Correct Procedure, Correct Position, site marked,  Risks and benefits discussed,  Surgical consent,  Pre-op evaluation,  At surgeon's request and post-op pain management  Laterality: Right  Prep: chloraprep       Needles:  Injection technique: Single-shot  Needle Type: Echogenic Stimulator Needle     Needle Length: 9cm  Needle Gauge: 21     Additional Needles:   Procedures:,,,, ultrasound used (permanent image in chart),,    Narrative:  Start time: 03/07/2023 10:55 AM End time: 03/07/2023 11:05 AM Injection made incrementally with aspirations every 5 mL.  Performed by: Personally  Anesthesiologist: Murvin Natal, MD  Additional Notes: Functioning IV was confirmed and monitors were applied.  A timeout was performed. Sterile prep, hand hygiene and sterile gloves were used. A 2mm 21ga Arrow echogenic stimulator needle was used. Negative aspiration and negative test dose prior to incremental administration of local anesthetic. The patient tolerated the procedure well.  Ultrasound guidance: relevent anatomy identified, needle position confirmed, local anesthetic spread visualized around nerve(s), vascular puncture avoided.  Image printed for medical record.

## 2023-03-07 NOTE — Anesthesia Preprocedure Evaluation (Addendum)
Anesthesia Evaluation  Patient identified by MRN, date of birth, ID band Patient awake    Reviewed: Allergy & Precautions, NPO status , Patient's Chart, lab work & pertinent test results  Airway Mallampati: II  TM Distance: >3 FB Neck ROM: Full    Dental no notable dental hx.    Pulmonary former smoker   Pulmonary exam normal        Cardiovascular Normal cardiovascular exam+ dysrhythmias (s/p ablation) Atrial Fibrillation      Neuro/Psych negative neurological ROS  negative psych ROS   GI/Hepatic negative GI ROS, Neg liver ROS,,,  Endo/Other  negative endocrine ROS    Renal/GU negative Renal ROS     Musculoskeletal negative musculoskeletal ROS (+)    Abdominal   Peds  Hematology negative hematology ROS (+)   Anesthesia Other Findings Tear of distal tendon of biceps brachii  Reproductive/Obstetrics                             Anesthesia Physical Anesthesia Plan  ASA: 2  Anesthesia Plan: Regional   Post-op Pain Management:    Induction: Intravenous  PONV Risk Score and Plan: 1 and Ondansetron, Dexamethasone, Propofol infusion, Midazolam and Treatment may vary due to age or medical condition  Airway Management Planned: Simple Face Mask  Additional Equipment:   Intra-op Plan:   Post-operative Plan:   Informed Consent: I have reviewed the patients History and Physical, chart, labs and discussed the procedure including the risks, benefits and alternatives for the proposed anesthesia with the patient or authorized representative who has indicated his/her understanding and acceptance.     Dental advisory given  Plan Discussed with: CRNA  Anesthesia Plan Comments:        Anesthesia Quick Evaluation

## 2023-03-07 NOTE — Transfer of Care (Signed)
Immediate Anesthesia Transfer of Care Note  Patient: Adam Green  Procedure(s) Performed: DISTAL BICEPS TENDON REPAIR (Right: Arm Lower)  Patient Location: PACU  Anesthesia Type:MAC combined with regional for post-op pain  Level of Consciousness: drowsy and patient cooperative  Airway & Oxygen Therapy: Patient Spontanous Breathing and Patient connected to face mask oxygen  Post-op Assessment: Report given to RN and Post -op Vital signs reviewed and stable  Post vital signs: Reviewed and stable  Last Vitals:  Vitals Value Taken Time  BP 102/53 03/07/23 1236  Temp    Pulse 56 03/07/23 1237  Resp 14 03/07/23 1237  SpO2 98 % 03/07/23 1237  Vitals shown include unvalidated device data.  Last Pain:  Vitals:   03/07/23 1042  TempSrc: Oral  PainSc: 0-No pain      Patients Stated Pain Goal: 3 ([id number])  Complications: No notable events documented.

## 2023-03-08 ENCOUNTER — Encounter (HOSPITAL_BASED_OUTPATIENT_CLINIC_OR_DEPARTMENT_OTHER): Payer: Self-pay | Admitting: Orthopedic Surgery

## 2023-03-15 DIAGNOSIS — M25621 Stiffness of right elbow, not elsewhere classified: Secondary | ICD-10-CM | POA: Diagnosis not present

## 2023-03-29 DIAGNOSIS — M25621 Stiffness of right elbow, not elsewhere classified: Secondary | ICD-10-CM | POA: Diagnosis not present

## 2023-04-12 DIAGNOSIS — M25621 Stiffness of right elbow, not elsewhere classified: Secondary | ICD-10-CM | POA: Diagnosis not present

## 2023-11-17 DIAGNOSIS — M7042 Prepatellar bursitis, left knee: Secondary | ICD-10-CM | POA: Diagnosis not present

## 2023-11-21 DIAGNOSIS — L814 Other melanin hyperpigmentation: Secondary | ICD-10-CM | POA: Diagnosis not present

## 2023-11-21 DIAGNOSIS — L249 Irritant contact dermatitis, unspecified cause: Secondary | ICD-10-CM | POA: Diagnosis not present

## 2023-11-21 DIAGNOSIS — D225 Melanocytic nevi of trunk: Secondary | ICD-10-CM | POA: Diagnosis not present

## 2023-11-21 DIAGNOSIS — L821 Other seborrheic keratosis: Secondary | ICD-10-CM | POA: Diagnosis not present

## 2023-12-05 ENCOUNTER — Telehealth: Payer: BC Managed Care – PPO | Admitting: Physician Assistant

## 2023-12-05 DIAGNOSIS — H5712 Ocular pain, left eye: Secondary | ICD-10-CM

## 2023-12-05 MED ORDER — ERYTHROMYCIN 5 MG/GM OP OINT: 1.0000 | TOPICAL_OINTMENT | Freq: Three times a day (TID) | OPHTHALMIC | 0 refills | Status: AC

## 2023-12-05 NOTE — Patient Instructions (Signed)
  [name], thank you for joining [name], PA-C for today's virtual visit.  While this provider is not your primary care provider (PCP), if your PCP is located in our provider database this encounter information will be shared with them immediately following your visit.   A [facility] MyChart account gives you access to today's visit and all your visits, tests, and labs performed at [facility]  click here if you don't have a [facility] MyChart account or go to [email or web address]  Consent: (Patient) Adam Green provided verbal consent for this virtual visit at the beginning of the encounter.  Current Medications:  Current Outpatient Medications:    erythromycin  ophthalmic ointment, Place 1 Application into the left eye 3 (three) times daily for 5 days., Disp: 15 g, Rfl: 0   meloxicam (MOBIC) 7.5 MG tablet, Take 7.5 mg by mouth daily., Disp: , Rfl:    Medications ordered in this encounter:  Meds ordered this encounter  Medications   erythromycin  ophthalmic ointment    Sig: Place 1 Application into the left eye 3 (three) times daily for 5 days.    Dispense:  15 g    Refill:  0    Supervising Provider:   BLAISE ALEENE KIDD [[id number]]     *If you need refills on other medications prior to your next appointment, please contact your pharmacy*  Follow-Up: Call back or seek an in-person evaluation if the symptoms worsen or if the condition fails to improve as anticipated.  [facility] [phone]  Other Instructions Use antibiotic ointment as prescribed.  If any changes in symptoms or no improvement please be seen in person for evaluation.    If you have been instructed to have an in-person evaluation today at a local Urgent Care facility, please use the link below. It will take you to a list of all of our available [facility], including address, phone number and hours of operation. Please do not delay care.  [facility]  If you or a family member do not have a primary care provider, use the link below to schedule a visit and establish care. When you choose a  primary care physician or advanced practice provider, you gain a long-term partner in health. Find a Primary Care Provider  Learn more about 's in-office and virtual care options:  - Get Care Now

## 2023-12-05 NOTE — Progress Notes (Signed)
 Virtual Visit Consent   Adam Green, you are scheduled for a virtual visit with a [facility] provider today. Just as with appointments in the office, your consent must be obtained to participate. Your consent will be active for this visit and any virtual visit you may have with one of our providers in the next 365 days. If you have a MyChart account, a copy of this consent can be sent to you electronically.  As this is a virtual visit, video technology does not allow for your provider to perform a traditional examination. This may limit your provider's ability to fully assess your condition. If your provider identifies any concerns that need to be evaluated in person or the need to arrange testing (such as labs, EKG, etc.), we will make arrangements to do so. Although advances in technology are sophisticated, we cannot ensure that it will always work on either your end or our end. If the connection with a video visit is poor, the visit may have to be switched to a telephone visit. With either a video or telephone visit, we are not always able to ensure that we have a secure connection.  By engaging in this virtual visit, you consent to the provision of healthcare and authorize for your insurance to be billed (if applicable) for the services provided during this visit. Depending on your insurance coverage, you may receive a charge related to this service.  I need to obtain your verbal consent now. Are you willing to proceed with your visit today? Adam Green has provided verbal consent on 12/05/2023 for a virtual visit (video or telephone). Adam PEDLAR Ward, PA-C  Date: 12/05/2023 6:22 PM  Virtual Visit via Video Note   I, Adam Green, connected with  Adam Green  ([id number], May 28, 1981) on 12/05/23 at  6:15 PM EST by a video-enabled telemedicine application and verified that I am speaking with the correct person using two identifiers.  Location: Patient: Virtual Visit  Location Patient: Home Provider: Virtual Visit Location Provider: Home Office   I discussed the limitations of evaluation and management by telemedicine and the availability of in person appointments. The patient expressed understanding and agreed to proceed.    History of Present Illness: Adam Green is a 42 y.o. 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  Denies visual changes.  He reports pain is somewhat better today.  Light sensitive.  He has tried nothing for the sx.  He does not wear contacts or glasses.  Denies discharge.  HPI: HPI  Problems:  Patient Active Problem List   Diagnosis Date Noted   A-fib (HCC) 11/18/2016   PAF (paroxysmal atrial fibrillation) (HCC) 09/02/2016    Allergies:  Allergies  Allergen Reactions   Dexpak 10 Day [Dexamethasone ] Swelling    extra weight gain   Xarelto  [Rivaroxaban ] Other (See Comments)    Severe leg pain and insomnia   Medications:  Current Outpatient Medications:    erythromycin  ophthalmic ointment, Place 1 Application into the left eye 3 (three) times daily for 5 days., Disp: 15 g, Rfl: 0   meloxicam (MOBIC) 7.5 MG tablet, Take 7.5 mg by mouth daily., Disp: , Rfl:   Observations/Objective: Patient is well-developed, well-nourished in no acute distress.  Resting comfortably at home.  Head is normocephalic, atraumatic.  No labored  breathing.  Speech is clear and coherent with logical content.  Patient is alert and oriented at baseline.    Assessment and Plan: 1. Eye pain, left (Primary)  Given tomorrow is a holiday will go ahead and send in antibiotic ointment, but gave strict in person evaluation precautions if no improvement later this week.  At this time no visual changes and pain is improving somewhat.   Follow Up Instructions: I discussed the assessment and treatment  plan with the patient. The patient was provided an opportunity to ask questions and all were answered. The patient agreed with the plan and demonstrated an understanding of the instructions.  A copy of instructions were sent to the patient via MyChart unless otherwise noted below.     The patient was advised to call back or seek an in-person evaluation if the symptoms worsen or if the condition fails to improve as anticipated.    Adam PEDLAR Ward, PA-C

## 2023-12-14 ENCOUNTER — Encounter: Payer: Self-pay | Admitting: Cardiology

## 2023-12-14 ENCOUNTER — Ambulatory Visit: Payer: BC Managed Care – PPO | Attending: Cardiology | Admitting: Cardiology

## 2023-12-14 VITALS — BP 136/72 | HR 73 | Ht 69.0 in | Wt 162.2 lb

## 2023-12-14 DIAGNOSIS — I480 Paroxysmal atrial fibrillation: Secondary | ICD-10-CM

## 2023-12-14 NOTE — Patient Instructions (Signed)
 Medication Instructions:  Your physician recommends that you continue on your current medications as directed. Please refer to the Current Medication list given to you today.  *If you need a refill on your cardiac medications before your next appointment, please call your pharmacy*   Lab Work: None ordered   Testing/Procedures: None ordered   Follow-Up: At [facility], you and your health needs are our priority.  As part of our continuing mission to provide you with exceptional heart care, we have created designated Provider Care Teams.  These Care Teams include your primary Cardiologist (physician) and Advanced Practice Providers (APPs -  Physician Assistants and Nurse Practitioners) who all work together to provide you with the care you need, when you need it.  Your next appointment:   6 month(s)  The format for your next appointment:   In Person  Provider:   Loman Brooklyn, MD    Thank you for choosing [facility]!!   Dory Horn, RN [phone]

## 2023-12-14 NOTE — Progress Notes (Signed)
  Electrophysiology Office Note:   Date:  12/14/2023  ID:  Adam Green, DOB 09-01-1981, MRN [id number]  Primary Cardiologist: None Primary Heart Failure: None Electrophysiologist: Celese Banner Gladis Norton, MD      History of Present Illness:   Adam Green is a 43 y.o. male with h/o atrial fibrillation seen today for  for Electrophysiology evaluation of atrial fibrillation at the request of self-referral.    He has a history of atrial fibrillation with ablation in 2017.  He has been having more frequent palpitations.  He wore a Zio patch that showed PACs and no atrial fibrillation.  He has been having more frequent palpitations.  Palpitations occur a few times a month.  He does have episodes where they are worse.  He feels this is worse with anxiety and stress.  He feels dizzy, fatigued when these occur.  Review of systems complete and found to be negative unless listed in HPI.   EP Information / Studies Reviewed:    EKG is ordered today. Personal review as below.  EKG Interpretation Date/Time:  Thursday December 14 2023 08:27:05 EST Ventricular Rate:  73 PR Interval:  152 QRS Duration:  86 QT Interval:  362 QTC Calculation: 398 R Axis:   87  Text Interpretation: Normal sinus rhythm Normal ECG When compared with ECG of 05-Mar-2020 09:04, No significant change was found Confirmed by Mariza Bourget ([id number]) on 12/14/2023 8:51:25 AM    Risk Assessment/Calculations:    CHA2DS2-VASc Score = 0   This indicates a 0.2% annual risk of stroke. The patient's score is based upon: CHF History: 0 HTN History: 0 Diabetes History: 0 Stroke History: 0 Vascular Disease History: 0 Age Score: 0 Gender Score: 0              Physical Exam:   VS:  BP 136/72 (BP Location: Left Arm, Patient Position: Sitting, Cuff Size: Normal)   Pulse 73   Ht 5' 9 (1.753 m)   Wt 162 lb 3.2 oz (73.6 kg)   SpO2 97%   BMI 23.95 kg/m    Wt Readings from Last 3 Encounters:  12/14/23 162 lb 3.2 oz  (73.6 kg)  03/07/23 151 lb 7.3 oz (68.7 kg)  05/24/22 155 lb (70.3 kg)     GEN: Well nourished, well developed in no acute distress NECK: No JVD; No carotid bruits CARDIAC: Regular rate and rhythm, no murmurs, rubs, gallops RESPIRATORY:  Clear to auscultation without rales, wheezing or rhonchi  ABDOMEN: Soft, non-tender, non-distended EXTREMITIES:  No edema; No deformity   ASSESSMENT AND PLAN:    1.  Paroxysmal atrial fibrillation: Post ablation in 2017.  He is continue to have palpitations.  He has had PACs in the past.  This documented on cardiac monitor.  He would prefer not to wear a monitor.  He obtain a Kardia mobile device.  I Hildur Bayer see him back in 6 months.  He Jessabelle Markiewicz send tracings via MyChart if he has any episodes.  Follow up with Dr. Norton in 6 months  Signed, Caila Cirelli Gladis Norton, MD

## 2024-02-13 ENCOUNTER — Encounter: Payer: Self-pay | Admitting: Cardiology

## 2024-02-13 ENCOUNTER — Telehealth: Payer: Self-pay | Admitting: Cardiology

## 2024-02-13 NOTE — Telephone Encounter (Signed)
 Patient is calling to talk with nurse about earlier conversation

## 2024-02-13 NOTE — Telephone Encounter (Addendum)
 Spoke with patient and he states he was having fluttering in his chest and it felt heavy. He was also experiencing SOB this morning while he was at workHe has sent over EKG from his watch via mychart. HR currently 79 and his oxygen level was at 97%. He states he feels betters. He would like for you to review his EKG. ED precautions discussed

## 2024-02-21 ENCOUNTER — Encounter: Payer: Self-pay | Admitting: Cardiology

## 2024-02-23 NOTE — Telephone Encounter (Signed)
 Called the patient to discuss his palpitations.  Reviewed his Pete Pelt tracings that he uploaded into his MyChart account.  Kardia mobile tracings appear to be sinus rhythm.  There is quite a bit of artifact, but the R to R interval remains stable.  I offered the patient to continue to monitor versus have an ILR implanted.  For now, he Shakayla Hickox continue to monitor this and Emalea Mix let us know if he wishes for alternative management.  Loman Brooklyn, MD

## 2024-07-03 ENCOUNTER — Other Ambulatory Visit: Payer: Self-pay

## 2024-07-03 ENCOUNTER — Emergency Department (HOSPITAL_BASED_OUTPATIENT_CLINIC_OR_DEPARTMENT_OTHER)
Admission: EM | Admit: 2024-07-03 | Discharge: 2024-07-03 | Discharge status: Against Medical Advice | Attending: Emergency Medicine | Admitting: Emergency Medicine

## 2024-07-03 ENCOUNTER — Emergency Department (HOSPITAL_BASED_OUTPATIENT_CLINIC_OR_DEPARTMENT_OTHER): Admitting: Radiology

## 2024-07-03 DIAGNOSIS — R079 Chest pain, unspecified: Secondary | ICD-10-CM | POA: Insufficient documentation

## 2024-07-03 DIAGNOSIS — R0602 Shortness of breath: Secondary | ICD-10-CM | POA: Diagnosis not present

## 2024-07-03 DIAGNOSIS — R002 Palpitations: Secondary | ICD-10-CM | POA: Diagnosis not present

## 2024-07-03 DIAGNOSIS — Z5329 Procedure and treatment not carried out because of patient's decision for other reasons: Secondary | ICD-10-CM | POA: Insufficient documentation

## 2024-07-03 DIAGNOSIS — R0789 Other chest pain: Secondary | ICD-10-CM | POA: Diagnosis not present

## 2024-07-03 LAB — BASIC METABOLIC PANEL WITH GFR
Anion gap: 7 (ref 5–15)
BUN: 16 mg/dL (ref 6–20)
CO2: 29 mmol/L (ref 22–32)
Calcium: 9.6 mg/dL (ref 8.9–10.3)
Chloride: 106 mmol/L (ref 98–111)
Creatinine, Ser: 1.04 mg/dL (ref 0.61–1.24)
GFR, Estimated: >60 mL/min (ref >60)
Glucose, Bld: 146 mg/dL — ABNORMAL HIGH (ref 70–99)
Potassium: 3.8 mmol/L (ref 3.5–5.1)
Sodium: 141 mmol/L (ref 135–145)

## 2024-07-03 LAB — TROPONIN T, HIGH SENSITIVITY
Troponin T High Sensitivity: <15 ng/L (ref <19)
Troponin T High Sensitivity: <15 ng/L (ref <19)

## 2024-07-03 LAB — CBC
HCT: 42.4 % (ref 39.0–52.0)
Hemoglobin: 14.4 g/dL (ref 13.0–17.0)
MCH: 29.4 pg (ref 26.0–34.0)
MCHC: 34 g/dL (ref 30.0–36.0)
MCV: 86.5 fL (ref 80.0–100.0)
Platelets: 209 K/uL (ref 150–400)
RBC: 4.9 MIL/uL (ref 4.22–5.81)
RDW: 12.5 % (ref 11.5–15.5)
WBC: 4.2 K/uL (ref 4.0–10.5)
nRBC: 0 % (ref 0.0–0.2)

## 2024-07-03 NOTE — ED Notes (Signed)
 Patient requested to leave; discontinued monitor and exited department, not willing to stay for pending results, did not sign AMA... Oswaldo Paris, PA-C notified)

## 2024-07-03 NOTE — ED Provider Notes (Signed)
 [place] EMERGENCY DEPARTMENT AT [facility] Provider Note   CSN: [id number] Arrival date & time: 07/03/24  1252     Patient presents with: Chest Pain   Adam Green is a 43 y.o. male.   Patient to ED for evaluation of frequent episodes of palpitations described as fast rate and pounding. He feels a check heaviness but denies overt pain. He reports SOB with the episodes, which can be brief or longer lasting, and for the past 4 days have occurred throughout the day. No fever, cough, vomiting. History of atrial fibrillation s/p ablation in 2017.   The history is provided by the patient and the spouse. No language interpreter was used.  Chest Pain      Prior to Admission medications   Medication Sig Start Date End Date Taking? Authorizing Provider  meloxicam (MOBIC) 7.5 MG tablet Take 7.5 mg by mouth daily. Patient not taking: Reported on 12/14/2023    [provider]    Allergies: Dexpak 10 day [dexamethasone ] and Xarelto  [rivaroxaban ]    Review of Systems  Cardiovascular:  Positive for chest pain.    Updated Vital Signs BP 103/73   Pulse 69   Temp 97.9 F (36.6 C)   Resp 20   SpO2 97%   Physical Exam Vitals and nursing note reviewed.  Constitutional:      Appearance: Normal appearance. He is well-developed.  HENT:     Head: Normocephalic.  Neck:     Vascular: No carotid bruit.  Cardiovascular:     Rate and Rhythm: Normal rate and regular rhythm.     Heart sounds: No murmur heard. Pulmonary:     Effort: Pulmonary effort is normal.     Breath sounds: Normal breath sounds. No wheezing, rhonchi or rales.  Abdominal:     Palpations: Abdomen is soft.     Tenderness: There is no abdominal tenderness. There is no guarding or rebound.  Musculoskeletal:        General: Normal range of motion.     Cervical back: Normal range of motion and neck supple.     Right lower leg: No edema.     Left lower leg: No edema.  Skin:    General: Skin is  warm and dry.  Neurological:     General: No focal deficit present.     Mental Status: He is alert and oriented to person, place, and time.     (all labs ordered are listed, but only abnormal results are displayed) Labs Reviewed  BASIC METABOLIC PANEL WITH GFR - Abnormal; Notable for the following components:      Result Value   Glucose, Bld 146 (*)    All other components within normal limits  CBC  TROPONIN T, HIGH SENSITIVITY  TROPONIN T, HIGH SENSITIVITY    EKG: EKG Interpretation Date/Time:  Wednesday July 03 2024 13:07:23 EDT Ventricular Rate:  76 PR Interval:  152 QRS Duration:  96 QT Interval:  364 QTC Calculation: 409 R Axis:   84  Text Interpretation: Normal sinus rhythm Normal ECG When compared with ECG of 14-Dec-2023 08:27, No significant change was found since last tracing no significant change Confirmed by Lenor Hollering [phone]) on 07/03/2024 3:06:19 PM  Radiology: [facility] Chest 2 View Result Date: 07/03/2024 CLINICAL DATA:  Chest pain and intermittent shortness of breath for several weeks EXAM: CHEST - 2 VIEW COMPARISON:  10/03/2016 FINDINGS: The lungs appear clear. Cardiac and mediastinal contours normal. No blunting of the costophrenic angles. No significant bony  findings. IMPRESSION: 1. No active cardiopulmonary disease is radiographically apparent. Electronically Signed   By: Ryan Salvage M.D.   On: 07/03/2024 14:14     Procedures   Medications Ordered in the ED - No data to display  Clinical Course as of 07/03/24 1550  Wed Jul 03, 2024  1445 Patient with history of a-fib 2017 s/p ablation. Per chart review, last seen by cardiology ([place]) in January when having palpitations diagnosed by monitor as PAC's, no recurrent atrial fibrillation.  [SU]  1450 Chart review: March conversations in [place] with cardiology. Readings sent to office from patient's phone appears stable. No follow up in office planned.  [SU]  1527 Reviewed the labs, CXR, EKG with the  patient and wife (on phone). Dr. Lenor present. Will wait for delta trop and anticipate discharge. Patient is encouraged to follow up with his cardiologist in the outpatient setting.  [SU]  1548 Patient's delta trop still pending. Patient states he wants to leave and IV, and monitor discontinued. He left the department without further contact/discussion with me.  [SU]    Clinical Course User Index [SU] Odell Balls, PA-C                                 Medical Decision Making Amount and/or Complexity of Data Reviewed Labs: ordered. Radiology: ordered.        Final diagnoses:  Palpitations  Chest pain, unspecified type    ED Discharge Orders     None          Odell Balls, PA-C 07/03/24 1550    Lenor Hollering, MD 07/04/24 0710

## 2024-07-03 NOTE — ED Triage Notes (Signed)
 C/o CP and intermittent SHOB x couple weeks. Hx of afib and ablation.

## 2024-07-04 ENCOUNTER — Telehealth: Payer: Self-pay | Admitting: Cardiology

## 2024-07-04 DIAGNOSIS — R002 Palpitations: Secondary | ICD-10-CM

## 2024-07-04 DIAGNOSIS — I480 Paroxysmal atrial fibrillation: Secondary | ICD-10-CM

## 2024-07-04 DIAGNOSIS — R079 Chest pain, unspecified: Secondary | ICD-10-CM

## 2024-07-04 NOTE — Telephone Encounter (Signed)
 Pt called in stating he was in ED and would like to make an appt. Informed him Dr. Inocencio next available is 10/1 but APP has some openings sooner and he is also overdue for a f/u. Pt declined. He states he would like to have an echo and a heart monitor ordered. Please advise.

## 2024-07-04 NOTE — Telephone Encounter (Signed)
 Called patient about his message. He would like to talk to Dr. Inocencio and Maeola to discuss his options. Patient stated he has had more frequent palpitations and chest pain that sent him to the ED yesterday. He would like to hear something back by today. Will forward to Dr. Inocencio and his nurse.

## 2024-07-05 NOTE — Telephone Encounter (Signed)
 Patient called again to follow-up directly with RN Sherri.

## 2024-07-05 NOTE — Telephone Encounter (Signed)
 Discussed with patient.   States that the last month, things have been haywire.  Says he went to do chores on Sunday (outside) and it just started, lasted about 8-10 seconds, did one of those hard beats and I got dizzy/light headed.  It was just a weird feeling.  On Monday, after lunch he was loading some stuff up and it felt like a knife was coming through the bottom of my heart and it hurt.  Scared the crap out of me.  It was a sharp pain that lasted about 2 hours. 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  Denies sob, radiating pain.  Aware Dr. Inocencio agreeable to monitor and echocardiogram.  Will place orders and send pt some information via mychart as requested  He would like a 30 day monitor to ensure he has more time to catch something on the  monitor.  If he has a lot of palpations in the first couple weeks he is aware he can take off monitor early (but explained the cost of monitor will remain the same no matter the time he wears it) Pt agreeable to plan.

## 2024-07-08 ENCOUNTER — Encounter: Payer: Self-pay | Admitting: *Deleted

## 2024-07-08 ENCOUNTER — Ambulatory Visit (HOSPITAL_COMMUNITY)
Admission: RE | Admit: 2024-07-08 | Discharge: 2024-07-08 | Disposition: A | Discharge status: Home / Self Care | Source: Ambulatory Visit | Attending: Cardiology | Admitting: Cardiology

## 2024-07-08 ENCOUNTER — Ambulatory Visit: Payer: Self-pay | Admitting: Cardiology

## 2024-07-08 DIAGNOSIS — I480 Paroxysmal atrial fibrillation: Secondary | ICD-10-CM | POA: Insufficient documentation

## 2024-07-08 DIAGNOSIS — R079 Chest pain, unspecified: Secondary | ICD-10-CM | POA: Insufficient documentation

## 2024-07-08 LAB — ECHOCARDIOGRAM COMPLETE
Area-P 1/2: 3.3 cm2
S' Lateral: 2.9 cm

## 2024-07-08 NOTE — Progress Notes (Signed)
 Patient ID: Adam Green, male   DOB: [birth year], 43 y.o.   MRN: [id number] Patient enrolled for [employer]/ [employer] to ship a 30 day cardiac event monitor to his address on file. 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

## 2024-07-11 DIAGNOSIS — R002 Palpitations: Secondary | ICD-10-CM | POA: Diagnosis not present

## 2024-07-11 DIAGNOSIS — I480 Paroxysmal atrial fibrillation: Secondary | ICD-10-CM | POA: Diagnosis not present

## 2024-08-12 ENCOUNTER — Ambulatory Visit: Attending: Cardiology

## 2024-08-12 DIAGNOSIS — R002 Palpitations: Secondary | ICD-10-CM | POA: Diagnosis not present

## 2024-08-12 DIAGNOSIS — I480 Paroxysmal atrial fibrillation: Secondary | ICD-10-CM | POA: Diagnosis not present

## 2024-08-13 NOTE — Telephone Encounter (Signed)
 Spoke w/ patient - scheduled for 12/10 w/ Dr. Inocencio.

## 2024-11-12 NOTE — Progress Notes (Unsigned)
  Electrophysiology Office Note:   Date:  11/12/2024  ID:  Adam Green, DOB 01-29-1981, MRN [id number]  Primary Cardiologist: None Primary Heart Failure: None Electrophysiologist: Charnetta Wulff Gladis Norton, MD  {Click to update primary MD,subspecialty MD or APP then REFRESH:1}    History of Present Illness:   Adam Green is a 43 y.o. male with h/o atrial fibrillation seen today for routine electrophysiology followup.   Since last being seen in our clinic the patient reports doing ***.  he denies chest pain, palpitations, dyspnea, PND, orthopnea, nausea, vomiting, dizziness, syncope, edema, weight gain, or early satiety.   Review of systems complete and found to be negative unless listed in HPI.   EP Information / Studies Reviewed:    {EKGtoday:28818}       Risk Assessment/Calculations:    CHA2DS2-VASc Score = 0  {Confirm score is correct.  If not, click here to update score.  REFRESH note.  :1} This indicates a 0.2% annual risk of stroke. The patient's score is based upon: CHF History: 0 HTN History: 0 Diabetes History: 0 Stroke History: 0 Vascular Disease History: 0 Age Score: 0 Gender Score: 0             Physical Exam:   VS:  There were no vitals taken for this visit.   Wt Readings from Last 3 Encounters:  12/14/23 162 lb 3.2 oz (73.6 kg)  03/07/23 151 lb 7.3 oz (68.7 kg)  05/24/22 155 lb (70.3 kg)     GEN: Well nourished, well developed in no acute distress NECK: No JVD; No carotid bruits CARDIAC: {EPRHYTHM:28826}, no murmurs, rubs, gallops RESPIRATORY:  Clear to auscultation without rales, wheezing or rhonchi  ABDOMEN: Soft, non-tender, non-distended EXTREMITIES:  No edema; No deformity   ASSESSMENT AND PLAN:    1.  Paroxysmal atrial fibrillation: Post ablation in 2017.  Recent cardiac monitor with no episodes of atrial fibrillation and rare atrial ectopy.  Follow up with {EPMDS:28135::EP Team} {EPFOLLOW LE:71826}  Signed, Analese Sovine Gladis Norton, MD

## 2024-11-13 ENCOUNTER — Ambulatory Visit: Attending: Cardiology | Admitting: Cardiology

## 2024-11-13 ENCOUNTER — Encounter: Payer: Self-pay | Admitting: Cardiology

## 2024-11-13 VITALS — BP 100/72 | HR 76 | Ht 69.5 in | Wt 161.5 lb

## 2024-11-13 DIAGNOSIS — I480 Paroxysmal atrial fibrillation: Secondary | ICD-10-CM
